# Patient Record
Sex: Male | Born: 1970
Health system: Southern US, Community
[De-identification: ages and names within clinical notes are randomized; demographics above are authoritative.]

## PROBLEM LIST (undated history)

## (undated) DIAGNOSIS — T7840XA Allergy, unspecified, initial encounter: Secondary | ICD-10-CM

## (undated) DIAGNOSIS — M549 Dorsalgia, unspecified: Secondary | ICD-10-CM

## (undated) HISTORY — DX: Allergy, unspecified, initial encounter: T78.40XA

---

## 2010-03-20 ENCOUNTER — Encounter: Admission: RE | Admit: 2010-03-20 | Discharge: 2010-03-20 | Payer: Self-pay | Admitting: Family Medicine

## 2010-04-26 ENCOUNTER — Encounter
Admission: RE | Admit: 2010-04-26 | Discharge: 2010-04-26 | Payer: Self-pay | Source: Home / Self Care | Attending: Allergy | Admitting: Allergy

## 2011-11-20 ENCOUNTER — Ambulatory Visit (INDEPENDENT_AMBULATORY_CARE_PROVIDER_SITE_OTHER): Payer: 59 | Admitting: Family Medicine

## 2011-11-20 VITALS — BP 110/70 | HR 85 | Temp 98.4°F | Resp 16 | Ht 69.0 in | Wt 169.0 lb

## 2011-11-20 DIAGNOSIS — M459 Ankylosing spondylitis of unspecified sites in spine: Secondary | ICD-10-CM

## 2011-11-20 DIAGNOSIS — M545 Low back pain, unspecified: Secondary | ICD-10-CM

## 2011-11-20 LAB — POCT CBC
Granulocyte percent: 69.2 %G (ref 37–80)
HCT, POC: 42.9 % — AB (ref 43.5–53.7)
Hemoglobin: 13.1 g/dL — AB (ref 14.1–18.1)
Lymph, poc: 2.5 (ref 0.6–3.4)
MCHC: 30.5 g/dL — AB (ref 31.8–35.4)
MPV: 7.9 fL (ref 0–99.8)
POC Granulocyte: 8 — AB (ref 2–6.9)
POC MID %: 9.1 %M (ref 0–12)
RBC: 4.62 M/uL — AB (ref 4.69–6.13)

## 2011-11-20 MED ORDER — CELECOXIB 200 MG PO CAPS: 200.0000 mg | ORAL_CAPSULE | Freq: Two times a day (BID) | ORAL | Status: AC

## 2011-11-20 NOTE — Progress Notes (Signed)
Patient ID: Kevin Garza, male   DOB: [birth year], 41 y.o.   MRN: [id number] Kevin Garza is a 41 y.o. male who presents to Urgent Care today for worsening low back pain x 2 weeks.  1.  Lumbago:  41 yo with known ankylosing spondylitis who presents today with 2 week history of low back pain.  Works as Orthoptist.  Has been working more often than usual.  Stiffness in AM in back, also some stiffness in hands and wrist that easily loosens up after a few minutes of stretching.    Followed in past by Dr. Marcheta [facility], but has been doing well and hasn't seen her for past 2 years. Has occaisonal flares which have responded well to Celebrex in past.     Denies any neck pain, bowel/bladder incontinence, LE weakness, saddle anesthesia.    PMH reviewed.  Note ankylosing spondylitis. ROS as above otherwise neg.  No chest pain, palpitations, SOB, Fever, Chills, Abd pain, N/V/D.  Medications reviewed. No current outpatient prescriptions on file.    Exam:  BP 110/70  Pulse 85  Temp 98.4 F (36.9 C)  Resp 16  Ht 5\' 9"  (1.753 m)  Wt 169 lb (76.658 kg)  BMI 24.96 kg/m2 Gen: Well NAD HEENT: EOMI,  MMM Lungs: CTABL Nl WOB Heart: RRR no MRG Abd: NABS, NT, ND Back - Normal skin, Spine with normal alignment and no deformity.  No tenderness to vertebral process palpation.  Paraspinous muscles are tender and with notable spasm BL lumbar region.   Range of motion is full at neck but decreased lumbar sacral regions due to pain.  No LE weakness or numbness.  Neuro:  Sensation intact BL LE's, no limp.     Results for orders placed in visit on 11/20/11  POCT CBC      Component Value Range   WBC 11.5 (*) 4.6 - 10.2 K/uL   Lymph, poc 2.5  0.6 - 3.4   POC LYMPH PERCENT 21.7  10 - 50 %L   MID (cbc) 1.0 (*) 0 - 0.9   POC MID % 9.1  0 - 12 %M   POC Granulocyte 8.0 (*) 2 - 6.9   Granulocyte percent 69.2  37 - 80 %G   RBC 4.62 (*) 4.69 - 6.13 M/uL   Hemoglobin 13.1 (*) 14.1  - 18.1 g/dL   HCT, POC 40.9 (*) 81.1 - 53.7 %   MCV 93.3  80 - 97 fL   MCH, POC 28.5  27 - 31.2 pg   MCHC 30.5 (*) 31.8 - 35.4 g/dL   RDW, POC 91.4     Platelet Count, POC 449 (*) 142 - 424 K/uL   MPV 7.9  0 - 99.8 fL     Assessment and Plan:  1.  Lumbago:  Treat short-term Celebrex.  Await results of ESR and CMET.  May benefit from Prednisone, but hold off on this currently until see results of ESR and to see if trial of Celebrex helps.  Will call with results.    2.  Ankylosing spondylitis:  Checking CMET, CK, ESR today.  Recommended he follow-up with Rheumatology in next month or so since he hasn't been seen there in so long.  Patient agreed.

## 2011-11-20 NOTE — Patient Instructions (Addendum)
I have sent in the Celebrex for you. We will either call or send you the lab results in a letter. Following up with your rheumatologist would be a good idea, especially if this flare up doesn't improve.    It was good to meet you

## 2011-11-21 LAB — COMPREHENSIVE METABOLIC PANEL
ALT: 17 U/L (ref 0–53)
AST: 13 U/L (ref 0–37)
Albumin: 4.2 g/dL (ref 3.5–5.2)
Alkaline Phosphatase: 61 U/L (ref 39–117)
BUN: 17 mg/dL (ref 6–23)
Calcium: 9.3 mg/dL (ref 8.4–10.5)
Chloride: 103 mEq/L (ref 96–112)
Potassium: 4.2 mEq/L (ref 3.5–5.3)
Sodium: 138 mEq/L (ref 135–145)
Total Protein: 7.7 g/dL (ref 6.0–8.3)

## 2011-11-22 ENCOUNTER — Encounter: Payer: Self-pay | Admitting: Family Medicine

## 2011-12-11 IMAGING — CT CT HEAD W/O CM
2 series · 16 of 30 positions shown, 20 images · non-contrast
Comparison: None.

CLINICAL DATA: Headache and dizziness.

CT HEAD WITHOUT CONTRAST
TECHNIQUE: Contiguous axial images were obtained from the base of
the skull through the vertex without contrast.

[Series 2: head w/o · axial · non-contrast · 0.49mm/px · z∈[+46,+186]mm · 13 of 32 slices shown, 17 images]
[im 3/32  brain]
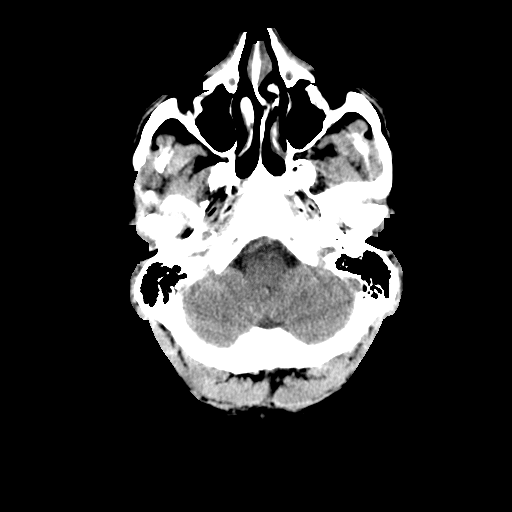
[im 3/32  bone]
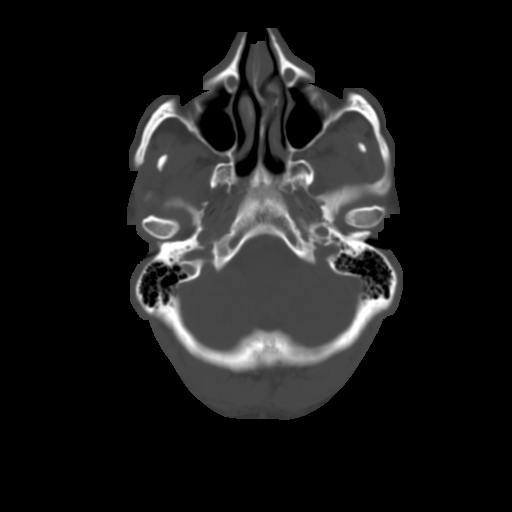
[im 5/32  brain]
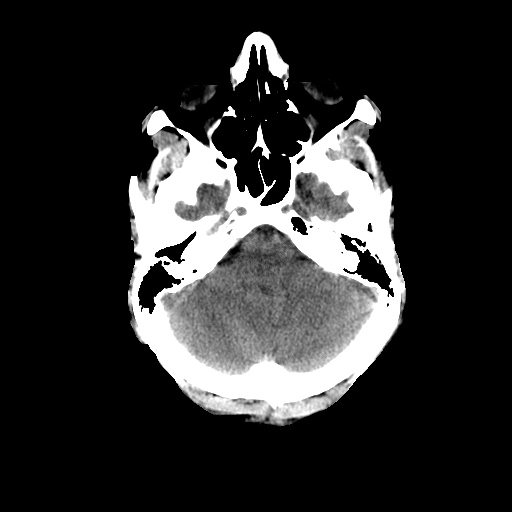
[im 7/32  brain]
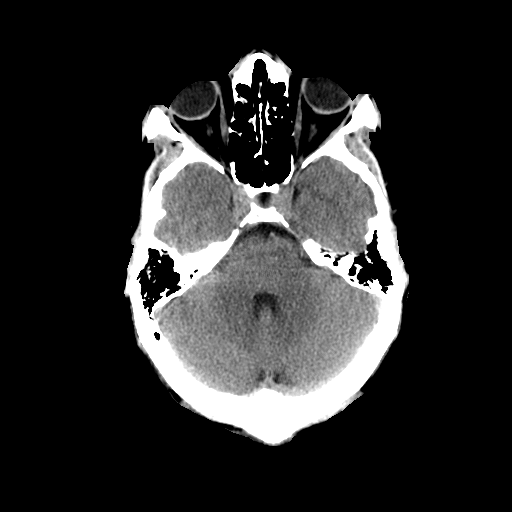
[im 9/32  brain]
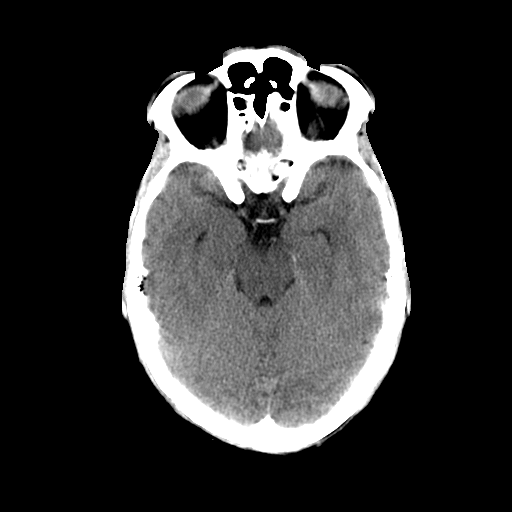
[im 12/32  brain]
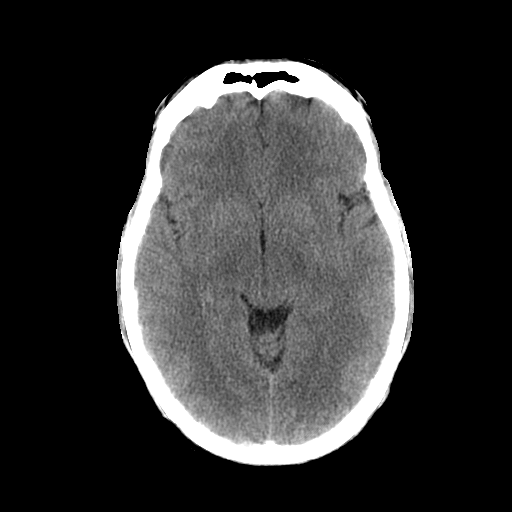
[im 12/32  bone]
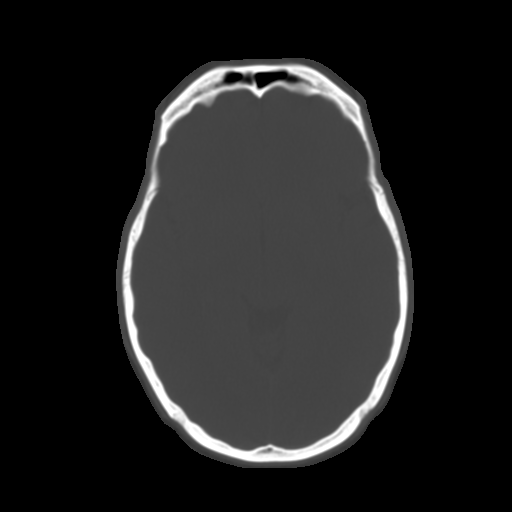
[im 14/32  brain]
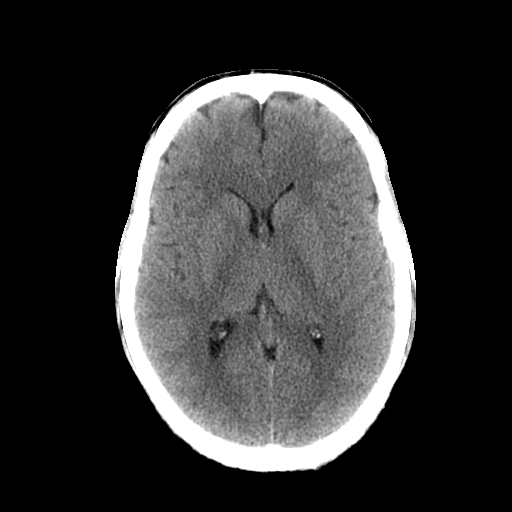
[im 16/32  brain]
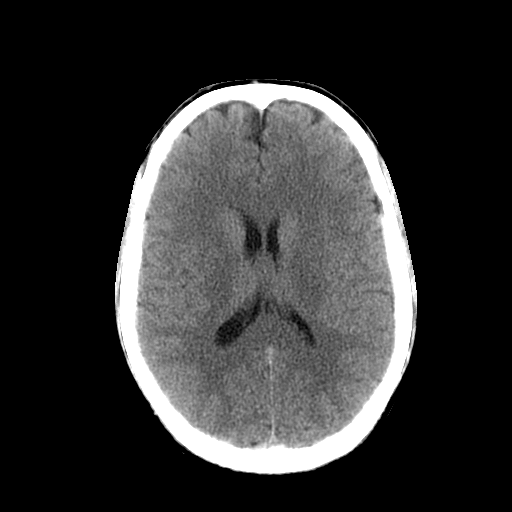
[im 18/32  brain]
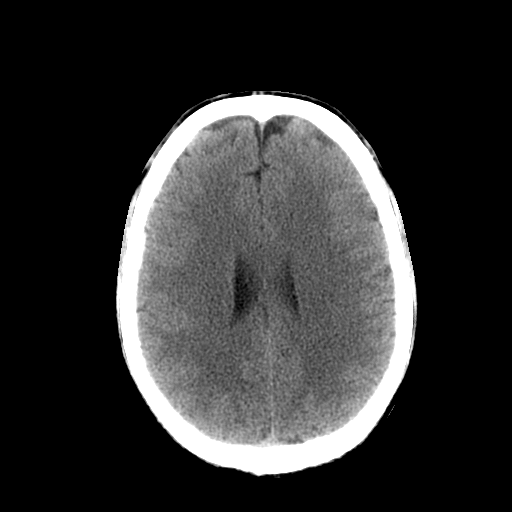
[im 20/32  brain]
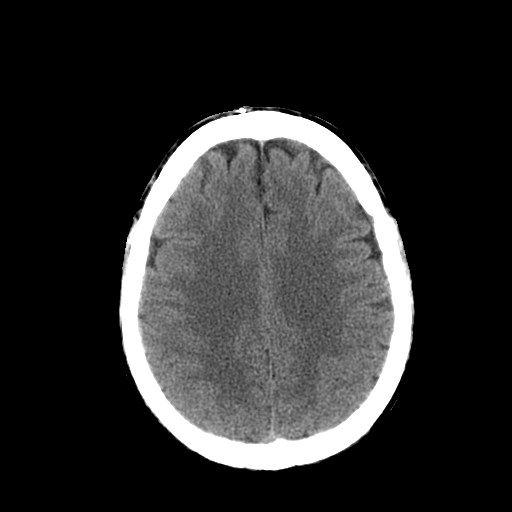
[im 20/32  bone]
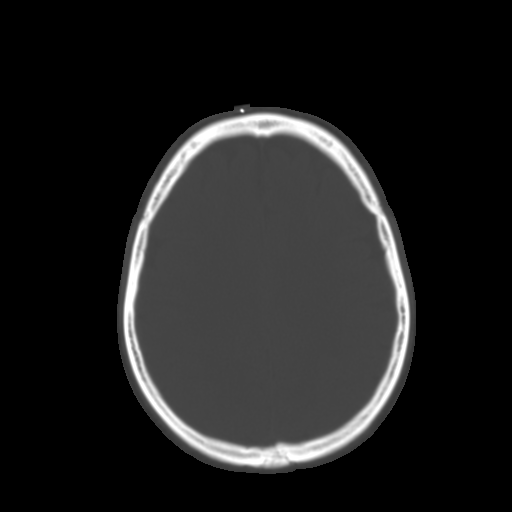
[im 23/32  brain]
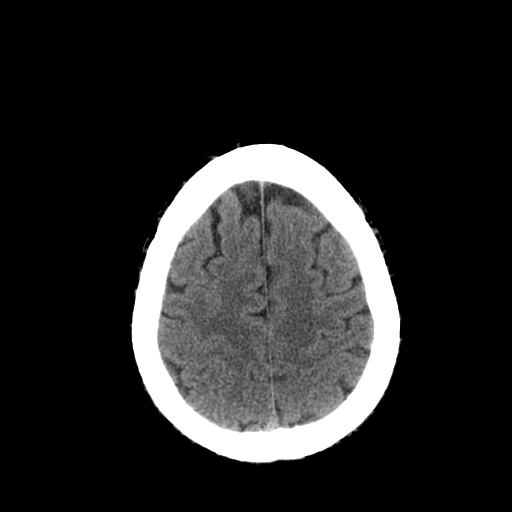
[im 25/32  brain]
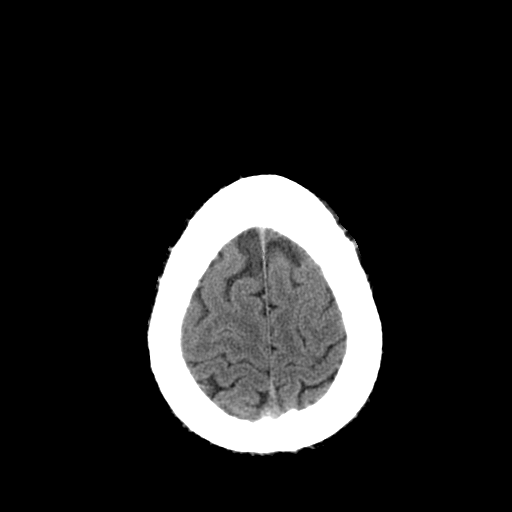
[im 27/32  brain]
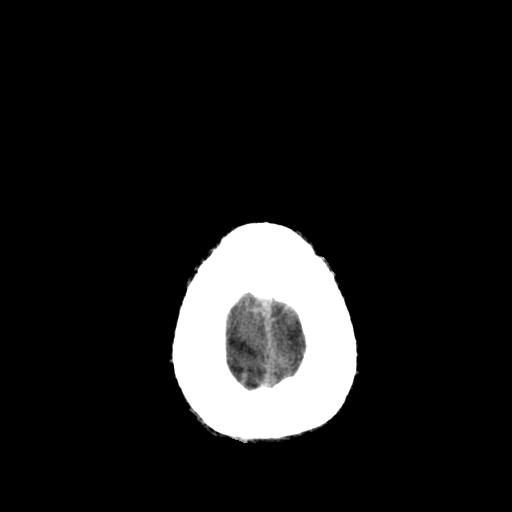
[im 29/32  brain]
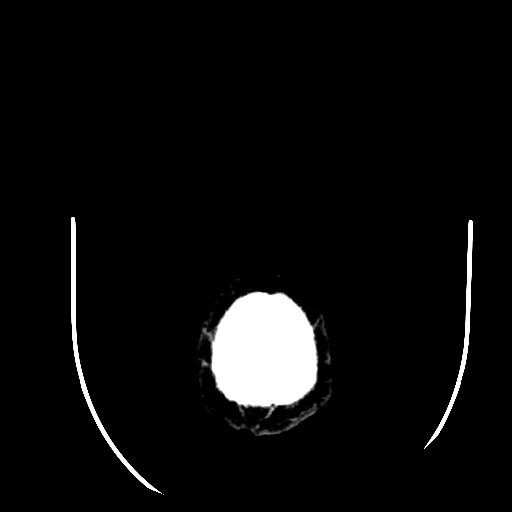
[im 29/32  bone]
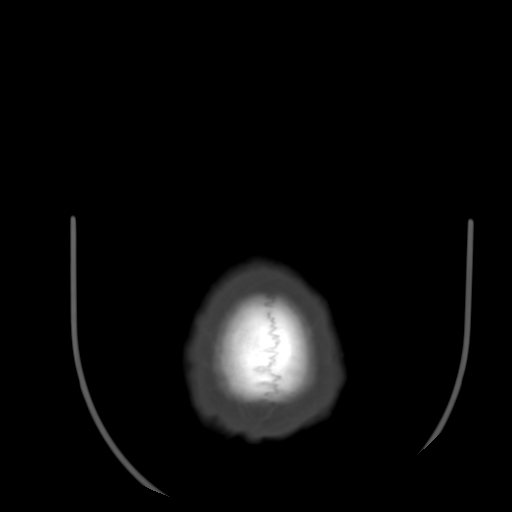

[Series 3: head bone · axial · 0.49mm/px · z∈[+46,+94]mm · 3 of 32 slices shown]
[im 3/32  bone]
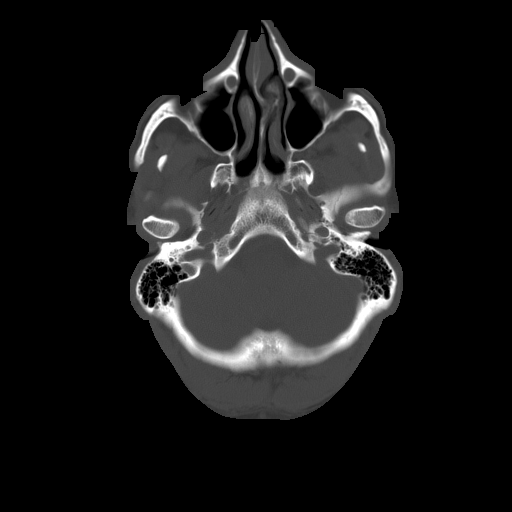
[im 7/32  bone]
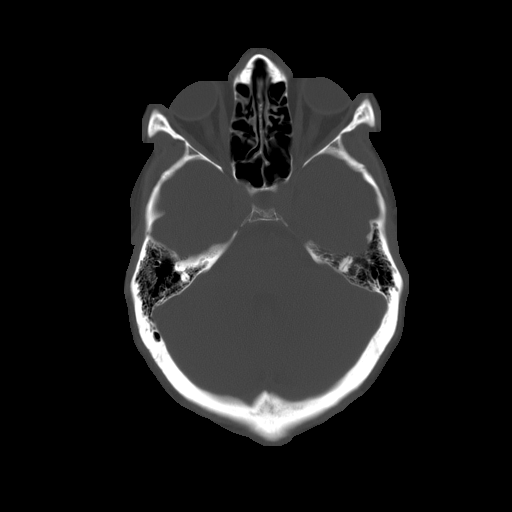
[im 12/32  bone]
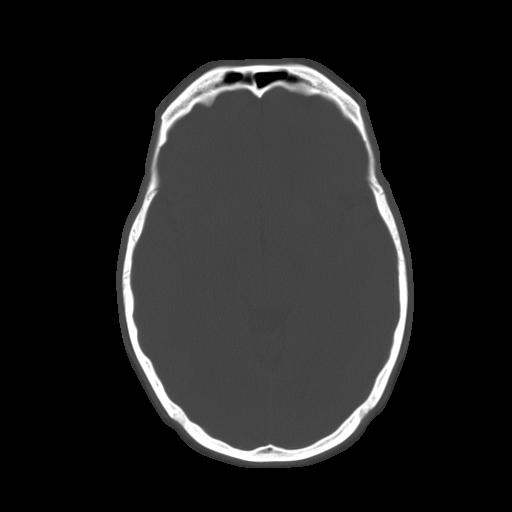

[16 of 30 positions shown; findings below may reference images not displayed]

FINDINGS: There is no acute intracranial infarction, hemorrhage, or
mass lesion.  The brain parenchyma is normal.  Skull is normal.
The visualized paranasal sinuses and mastoid air cells and middle
ear cavities are clear.
IMPRESSION: Normal CT scan of the head without contrast.

## 2012-01-17 IMAGING — CT CT PARANASAL SINUSES LIMITED
1 series · 10 of 12 positions shown, 13 images · non-contrast
Comparison: CT 03/20/2010.

CLINICAL DATA: History of pain under the eyes and around nodes.
Evaluation for sinusitis.

CT PARANASAL SINUS LIMITED WITHOUT CONTRAST
TECHNIQUE: Multidetector CT images of the paranasal sinuses were
obtained in a single plane without contrast.

[Series 3: cor soft · axial · 0.35mm/px · z∈[+8,+98]mm · 10 of 12 slices shown, 13 images]
[im 2/12  brain]
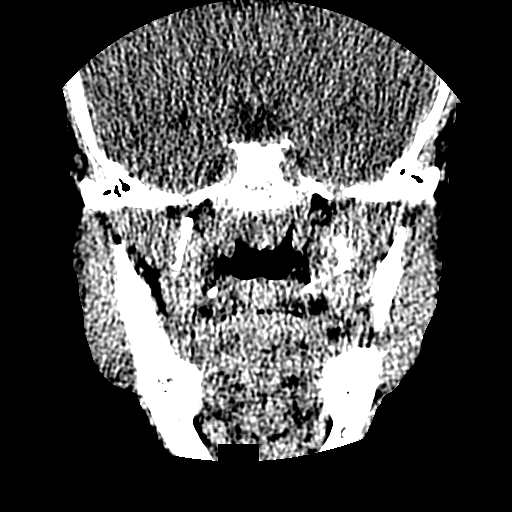
[im 2/12  bone]
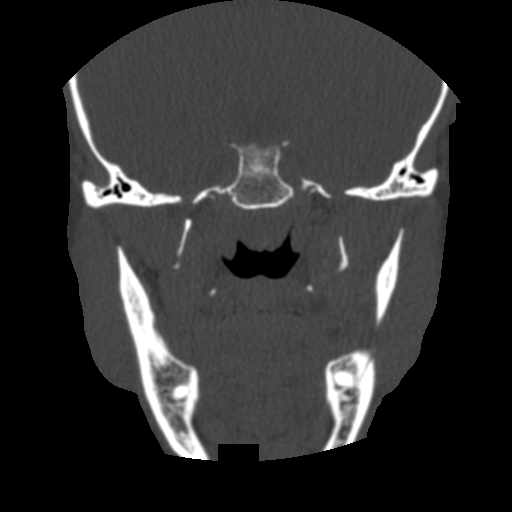
[im 3/12  bone]
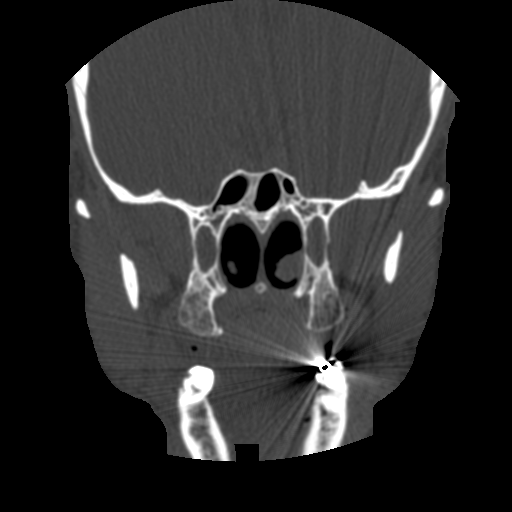
[im 4/12  bone]
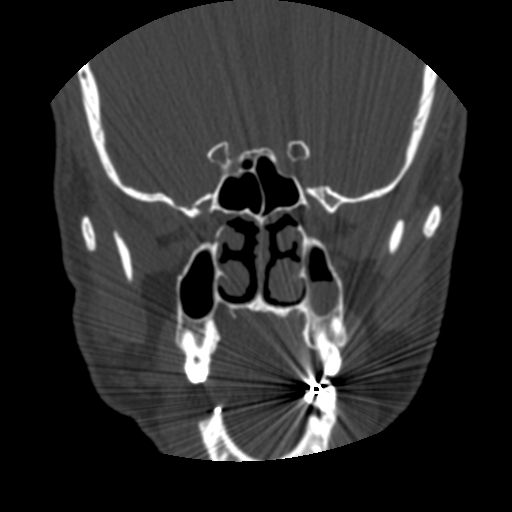
[im 5/12  bone]
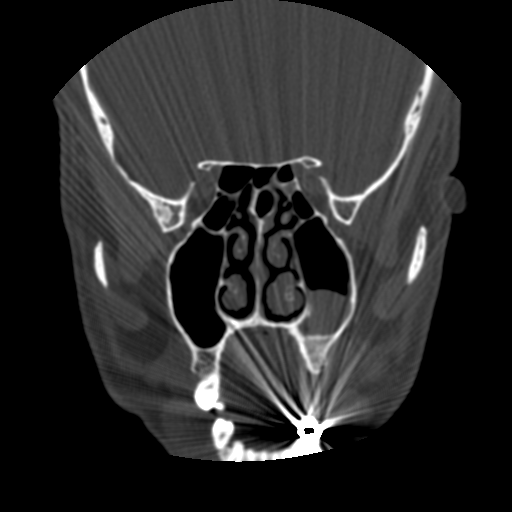
[im 6/12  brain]
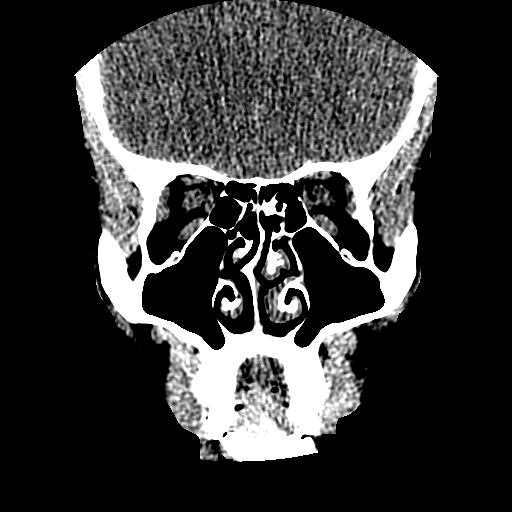
[im 6/12  bone]
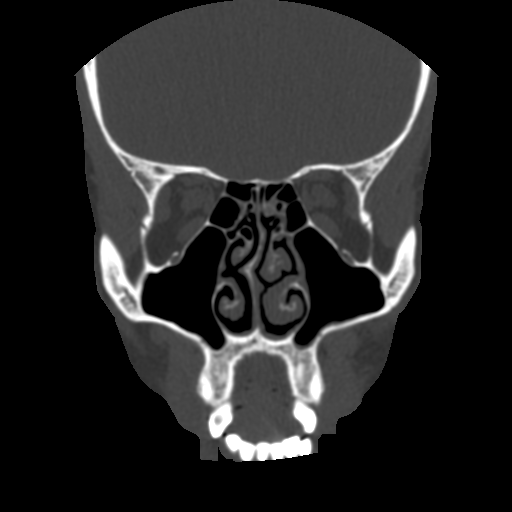
[im 7/12  bone]
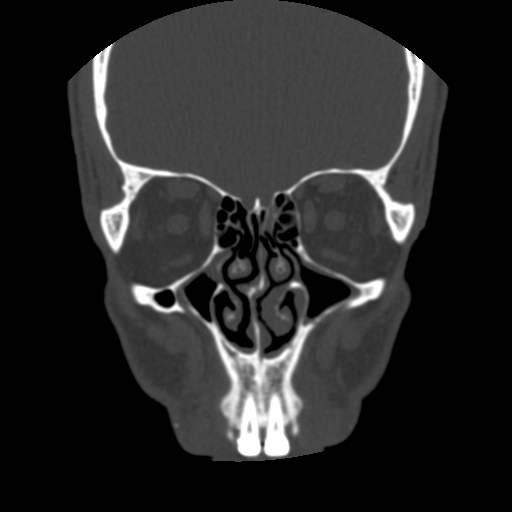
[im 8/12  bone]
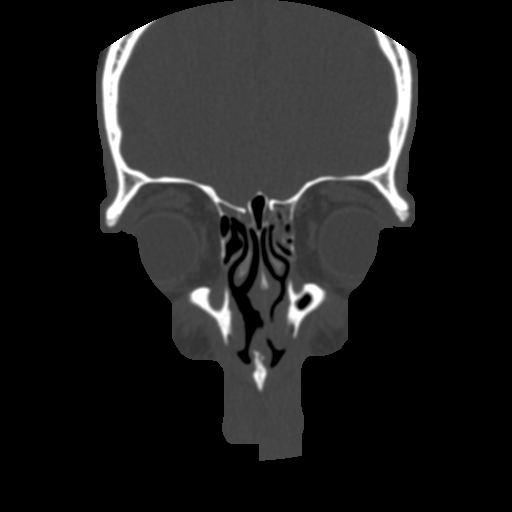
[im 9/12  bone]
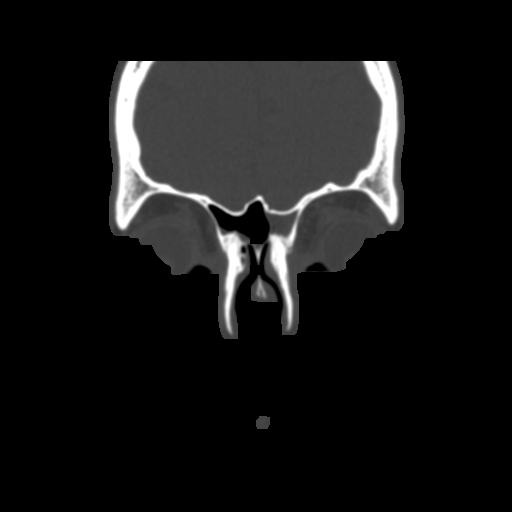
[im 10/12  brain]
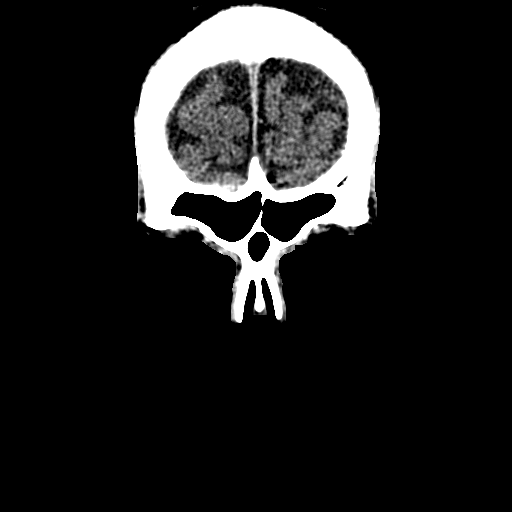
[im 10/12  bone]
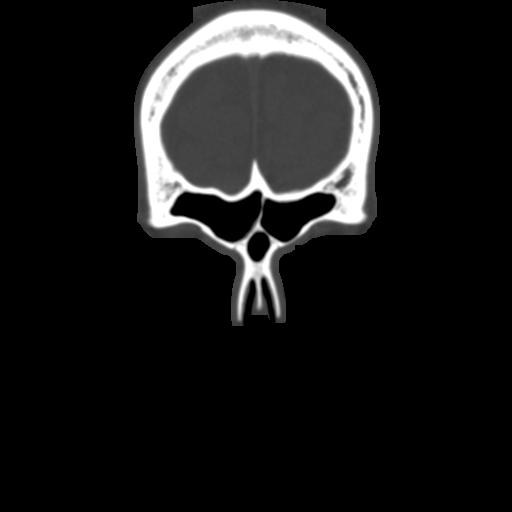
[im 11/12  bone]
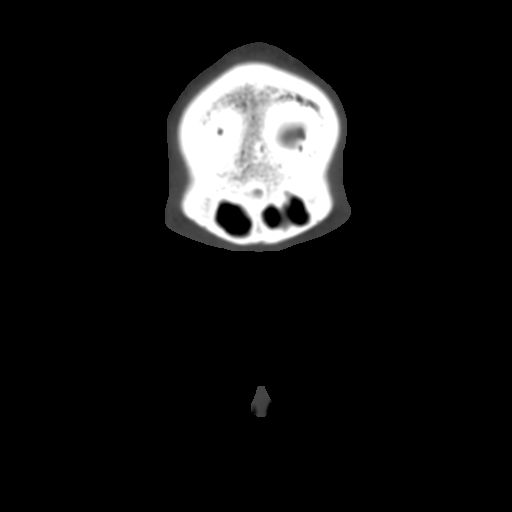

[10 of 12 positions shown; findings below may reference images not displayed]

FINDINGS: Nasal septum and nasal cavity:  There is deviation of the
midportion of the nasal septum to the left with spurring.  No
concha bullosa is evident.

Maxillary sinuses:  There is bilateral mucosal thickening involving
maxillary sinuses.  Rounded  opacity and inferior aspect of right
maxillary sinus is seen consistent with with mucous retention cyst
or polyp.  No air fluid levels are seen.  Right maxillary sinus
ostium is patent.  Left maxillary sinus ostium appears to be
occluded by mucosal thickening.

Sphenoid sinuses:  There is very minimal mucosal thickening
involving superior aspect of left sphenoid sinus.  No air fluid
levels are seen.

Frontal sinuses:  There is mucosal thickening  involving the
posterior aspect of the the right frontal sinus.  There is minimal
mucosal thickening involving the inferior medial aspect of the left
frontal sinus.  No air fluid levels are seen.

Ethmoid sinuses:  There is mucosal thickening involving bilateral
ethmoid air cells.  No air fluid levels seen.  No sinus expansion
or bony destruction is evident.

The small portion of mastoids demonstrated showed aeration of air
cells.
IMPRESSION: Mucosal thickening in right maxillary sinus with mucous retention
cyst or polyp inferiorly.  Right maxillary sinus ostium is patent.

Mucosal thickening in left maxillary sinus with occlusion of the
sinus ostium on the left.

Minimal mucosal thickening involving frontal and sphenoid sinuses.
Mucosal thickening involving bilateral ethmoid air cells.

No sinus expansion or bony destruction is seen.  No air fluid
levels were evident.

Nasal septal deviation and spurring to the left.

## 2012-07-13 ENCOUNTER — Ambulatory Visit (INDEPENDENT_AMBULATORY_CARE_PROVIDER_SITE_OTHER): Payer: 59 | Admitting: Emergency Medicine

## 2012-07-13 VITALS — BP 124/76 | HR 60 | Temp 98.0°F | Resp 16 | Ht 68.0 in | Wt 172.4 lb

## 2012-07-13 DIAGNOSIS — J329 Chronic sinusitis, unspecified: Secondary | ICD-10-CM

## 2012-07-13 DIAGNOSIS — R51 Headache: Secondary | ICD-10-CM

## 2012-07-13 DIAGNOSIS — J342 Deviated nasal septum: Secondary | ICD-10-CM

## 2012-07-13 MED ORDER — AMOXICILLIN-POT CLAVULANATE 875-125 MG PO TABS: 1.0000 | ORAL_TABLET | Freq: Two times a day (BID) | ORAL | Status: DC

## 2012-07-13 MED ORDER — FLUTICASONE PROPIONATE 50 MCG/ACT NA SUSP: 2.0000 | Freq: Every day | NASAL | Status: DC

## 2012-07-13 MED ORDER — PREDNISONE 20 MG PO TABS: ORAL_TABLET | ORAL | Status: DC

## 2012-07-13 NOTE — Progress Notes (Signed)
  Subjective:    Patient ID: Kevin Garza, male    DOB: 17-Nov-1970, 42 y.o.   MRN: [id number]  HPI Patient comes in today. He has environmental allergies regularly, and takes Zyrtec. The past 5 days he complains that he has a full feeling in his face, he has a lot of pressure especially on his right side. He also states that his ears are hurting. When he blows his nose his ears pop. He complains of a really bad headache. The drainage from his nose is clear. Denies any cough. Denies a sore throat or post nasal drip.     He had a scan of his sinuses and they found polyps. No infection was found. He decided to not have a surgery because of the recovery time. He has not used a nasal spray in a long time. He took an antibiotic and steroids.     Review of Systems     Objective:   Physical Exam  Throat is normal. TM is normal. Nasal septum deviation, swollen and red. Lungs are clear.       Assessment & Plan:   1. Augmentin 2. Nasal Spray  3. Taper dose of prednisone.   Follow up if symptoms worsen.

## 2012-07-13 NOTE — Patient Instructions (Signed)

## 2012-08-28 ENCOUNTER — Telehealth: Payer: Self-pay

## 2012-08-28 NOTE — Telephone Encounter (Signed)
Notified this was not a test ordered. Voiced understanding.

## 2012-08-28 NOTE — Telephone Encounter (Signed)
His wife is calling because her husband needs to know what blood type he is. The wife is on the HIPPA form so you can talk to her. Call back number is [phone]

## 2013-11-05 ENCOUNTER — Ambulatory Visit (INDEPENDENT_AMBULATORY_CARE_PROVIDER_SITE_OTHER): Payer: BC Managed Care – PPO | Admitting: Family Medicine

## 2013-11-05 VITALS — BP 116/60 | HR 63 | Temp 98.3°F | Resp 16 | Ht 67.25 in | Wt 173.0 lb

## 2013-11-05 DIAGNOSIS — J302 Other seasonal allergic rhinitis: Secondary | ICD-10-CM

## 2013-11-05 DIAGNOSIS — J3089 Other allergic rhinitis: Secondary | ICD-10-CM

## 2013-11-05 DIAGNOSIS — M542 Cervicalgia: Secondary | ICD-10-CM

## 2013-11-05 NOTE — Patient Instructions (Signed)
Ibuprofen 2 tablets twice a day as needed for pain Heat to neck Sudafed- 1 tablet in the morning and after lunch as needed for congestion Return if worsening symptoms

## 2013-11-05 NOTE — Progress Notes (Signed)
   Subjective:    Patient ID: Kevin Garza, male    DOB: 1970-12-11, 43 y.o.   MRN: [id number]  HPI Patient presents today with pain behind both ears and down teeth and beside nose. This has been going on for several weeks. Woke up this morning feeling fine, went to his job and started feeling bad.  Has history of sinus and allergy problems and has taken unknown allergy medicine with some improvement, but makes him feel "drunk." He is consistently using Flonase which he feels is somewhat helpful. Thinks this pain may be related to stress he is carrying in his neck as it feels like it is coming up from his neck sometimes. 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  Has history of back pain, ankylosing spondylosis. This rarely bothers him.   Review of Systems Occasional clear nasal drainage, no fever, occasional cough, no sore throat. No numbness, tingling down arms occasionally x 10 years    Objective:   Physical Exam  Vitals reviewed. Constitutional: He is oriented to person, place, and time. He appears well-developed and well-nourished.  HENT:  Head: Normocephalic and atraumatic.  Right Ear: Tympanic membrane, external ear and ear canal normal.  Left Ear: Tympanic membrane, external ear and ear canal normal.  Nose: Mucosal edema and rhinorrhea present. Right sinus exhibits no maxillary sinus tenderness and no frontal sinus tenderness. Left sinus exhibits no maxillary sinus tenderness and no frontal sinus tenderness.  Mouth/Throat: Uvula is midline and mucous membranes are normal. Oropharyngeal exudate and posterior oropharyngeal erythema present. No posterior oropharyngeal edema or tonsillar abscesses.  Eyes: Conjunctivae are normal. Right eye exhibits no discharge. Left eye exhibits no discharge. No scleral icterus.  Neck: Normal range of motion and full passive range of motion without pain. Neck supple. Muscular  tenderness present. No spinous process tenderness present. No rigidity. No edema, no erythema and normal range of motion present.  Cardiovascular: Normal rate, regular rhythm and normal heart sounds.   Pulmonary/Chest: Effort normal and breath sounds normal.  Musculoskeletal: Normal range of motion.  Lymphadenopathy:    He has no cervical adenopathy.  Neurological: He is alert and oriented to person, place, and time. He has normal reflexes.  Skin: Skin is warm and dry.  Psychiatric: He has a normal mood and affect. His behavior is normal. Judgment and thought content normal.      Assessment & Plan:  1. Neck pain  2. Other seasonal allergic rhinitis  Discussed patient's symptoms and that there doesn't seem to be an infectious process. Patient reports that he doesn't like to take medication. Encouraged him to try more aggressive symptomatic relief.  Patient Instructions  Ibuprofen 2 tablets twice a day as needed for pain Heat to neck Sudafed- 1 tablet in the morning and after lunch as needed for congestion Return if worsening symptoms   Elby Beck, FNP-BC  [facility]  11/05/2013 5:26 PM

## 2016-02-24 ENCOUNTER — Ambulatory Visit (INDEPENDENT_AMBULATORY_CARE_PROVIDER_SITE_OTHER): Payer: Managed Care, Other (non HMO) | Admitting: Family Medicine

## 2016-02-24 VITALS — BP 110/76 | HR 75 | Temp 98.4°F | Resp 16 | Ht 68.0 in | Wt 168.6 lb

## 2016-02-24 DIAGNOSIS — J301 Allergic rhinitis due to pollen: Secondary | ICD-10-CM | POA: Diagnosis not present

## 2016-02-24 DIAGNOSIS — Z716 Tobacco abuse counseling: Secondary | ICD-10-CM | POA: Diagnosis not present

## 2016-02-24 DIAGNOSIS — J0101 Acute recurrent maxillary sinusitis: Secondary | ICD-10-CM | POA: Diagnosis not present

## 2016-02-24 MED ORDER — FLUTICASONE PROPIONATE 50 MCG/ACT NA SUSP: 2.0000 | Freq: Every day | NASAL | 12 refills | Status: AC

## 2016-02-24 MED ORDER — AMOXICILLIN-POT CLAVULANATE 875-125 MG PO TABS: 1.0000 | ORAL_TABLET | Freq: Two times a day (BID) | ORAL | 0 refills | Status: DC

## 2016-02-24 NOTE — Progress Notes (Signed)
Subjective:    Patient ID: Kevin Garza, male    DOB: [birth year], 45 y.o.   MRN: [id number]  02/24/2016  sneezing (runny nose x 6 wks, pt declined flu shot); Headache; and Sore Throat   HPI This 45 y.o. male presents for six week history of sneezing, sore throat, headache. No fever/chills/sweats.  +HA; +sinus pressure. No ear pain.  +rhinorrhea yellow.  +nasal congestion.+PND  +cough; +sputum production yellow.  Wheezing the first week.  Mild SOB in mornings. No nausea, vomitng, diarrhea.  Evaluated at [facility] the first week; prescribed Allegra 115m daily for five days.  Also prescribed cough medication. +tobacco abuse.  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  +tobacco abuse.   Review of Systems  Constitutional: Negative for activity change, appetite change, chills, diaphoresis, fatigue and fever.  HENT: Positive for congestion, postnasal drip, rhinorrhea, sinus pressure, sneezing and sore throat. Negative for ear pain, trouble swallowing and voice change.   Respiratory: Positive for cough and shortness of breath.   Cardiovascular: Negative for chest pain, palpitations and leg swelling.  Gastrointestinal: Negative for abdominal pain, diarrhea, nausea and vomiting.  Endocrine: Negative for cold intolerance, heat intolerance, polydipsia, polyphagia and polyuria.  Skin: Negative for color change, rash and wound.  Neurological: Positive for headaches. Negative for dizziness, tremors, seizures, syncope, facial asymmetry, speech difficulty, weakness, light-headedness and numbness.  Psychiatric/Behavioral: Negative for dysphoric mood and sleep disturbance. The patient is not nervous/anxious.     Past Medical History:  Diagnosis Date  . Allergy    No past surgical history on file. No Known Allergies Current Outpatient Prescriptions  Medication Sig Dispense Refill  . cetirizine (ZYRTEC) 10 MG  tablet Take 10 mg by mouth daily.    . fluticasone (FLONASE) 50 MCG/ACT nasal spray Place 2 sprays into the nose daily. 16 g 6  . amoxicillin-clavulanate (AUGMENTIN) 875-125 MG tablet Take 1 tablet by mouth 2 (two) times daily. 20 tablet 0  . fluticasone (FLONASE) 50 MCG/ACT nasal spray Place 2 sprays into both nostrils daily. 16 g 12   No current facility-administered medications for this visit.    Social History   Social History  . Marital status: Married    Spouse name: N/A  . Number of children: N/A  . Years of education: N/A   Occupational History  . Not on file.   Social History Main Topics  . Smoking status: Current Every Day Smoker    Packs/day: 1.00    Years: 15.00    Types: Cigarettes  . Smokeless tobacco: Never Used  . Alcohol use 3.6 oz/week    6 Standard drinks or equivalent per week  . Drug use: No  . Sexual activity: Not on file   Other Topics Concern  . Not on file   Social History Narrative  . No narrative on file   Family History  Problem Relation Age of Onset  . Cancer Mother   . Hyperlipidemia Father        Objective:    BP 110/76   Pulse 75   Temp 98.4 F (36.9 C) (Oral)   Resp 16   Ht 5' 8"  (1.727 m)   Wt 168 lb 9.6 oz (76.5 kg)   SpO2 98%   BMI 25.64 kg/m  Physical Exam  Constitutional: He is oriented to person, place, and time. He appears well-developed and well-nourished. No distress.  HENT:  Head: Normocephalic and atraumatic.  Right Ear: Tympanic membrane, external ear and ear canal normal.  Left Ear: Tympanic membrane, external ear and ear canal normal.  Nose: Right sinus exhibits maxillary sinus tenderness. Right sinus exhibits no frontal sinus tenderness. Left sinus exhibits maxillary sinus tenderness. Left sinus exhibits no frontal sinus tenderness.  Mouth/Throat: Uvula is midline, oropharynx is clear and moist and mucous membranes are normal. No oropharyngeal exudate.  Eyes: Conjunctivae and EOM are normal. Pupils are  equal, round, and reactive to light.  Neck: Normal range of motion. Neck supple. Carotid bruit is not present. No thyromegaly present.  Cardiovascular: Normal rate, regular rhythm, normal heart sounds and intact distal pulses.  Exam reveals no gallop and no friction rub.   No murmur heard. Pulmonary/Chest: Effort normal and breath sounds normal. He has no wheezes. He has no rales.  Lymphadenopathy:    He has no cervical adenopathy.  Neurological: He is alert and oriented to person, place, and time. No cranial nerve deficit.  Skin: Skin is warm and dry. No rash noted. He is not diaphoretic.  Psychiatric: He has a normal mood and affect. His behavior is normal.  Nursing note and vitals reviewed.       Assessment & Plan:   1. Acute recurrent maxillary sinusitis   2. Acute seasonal allergic rhinitis due to pollen   3. Tobacco abuse counseling    -New. -rx for Flonase, Augmentin. -start Zyrtec 39m daily and nettie pot daily for two weeks and then PRN. -stop tobacco abuse.  No orders of the defined types were placed in this encounter.  Meds ordered this encounter  Medications  . cetirizine (ZYRTEC) 10 MG tablet    Sig: Take 10 mg by mouth daily.  .Marland Kitchenamoxicillin-clavulanate (AUGMENTIN) 875-125 MG tablet    Sig: Take 1 tablet by mouth 2 (two) times daily.    Dispense:  20 tablet    Refill:  0  . fluticasone (FLONASE) 50 MCG/ACT nasal spray    Sig: Place 2 sprays into both nostrils daily.    Dispense:  16 g    Refill:  12    No Follow-up on file.   Adilyn Humes MElayne Guerin M.D. [facility]  [phone]

## 2016-02-24 NOTE — Patient Instructions (Addendum)
IF you received an x-ray today, you will receive an invoice from [facility]. Please contact [facility] at [phone] with questions or concerns regarding your invoice.   IF you received labwork today, you will receive an invoice from [employer]. Please contact [facility] at [phone] with questions or concerns regarding your invoice.   Our billing staff will not be able to assist you with questions regarding bills from these companies.  You will be contacted with the lab results as soon as they are available. The fastest way to get your results is to activate your My Chart account. Instructions are located on the last page of this paperwork. If you have not heard from [place] regarding the results in 2 weeks, please contact this office.    Allergic Rhinitis Allergic rhinitis is when the mucous membranes in the nose respond to allergens. Allergens are particles in the air that cause your body to have an allergic reaction. This causes you to release allergic antibodies. Through a chain of events, these eventually cause you to release histamine into the blood stream. Although meant to protect the body, it is this release of histamine that causes your discomfort, such as frequent sneezing, congestion, and an itchy, runny nose.  CAUSES Seasonal allergic rhinitis (hay fever) is caused by pollen allergens that may come from grasses, trees, and weeds. Year-round allergic rhinitis (perennial allergic rhinitis) is caused by allergens such as house dust mites, pet dander, and mold spores. SYMPTOMS  Nasal stuffiness (congestion).  Itchy, runny nose with sneezing and tearing of the eyes. DIAGNOSIS Your health care provider can help you determine the allergen or allergens that trigger your symptoms. If you and your health care provider are unable to determine the allergen, skin or blood testing may be used. Your health care provider will diagnose your  condition after taking your health history and performing a physical exam. Your health care provider may assess you for other related conditions, such as asthma, pink eye, or an ear infection. TREATMENT Allergic rhinitis does not have a cure, but it can be controlled by:  Medicines that block allergy symptoms. These may include allergy shots, nasal sprays, and oral antihistamines.  Avoiding the allergen. Hay fever may often be treated with antihistamines in pill or nasal spray forms. Antihistamines block the effects of histamine. There are over-the-counter medicines that may help with nasal congestion and swelling around the eyes. Check with your health care provider before taking or giving this medicine. If avoiding the allergen or the medicine prescribed do not work, there are many new medicines your health care provider can prescribe. Stronger medicine may be used if initial measures are ineffective. Desensitizing injections can be used if medicine and avoidance does not work. Desensitization is when a patient is given ongoing shots until the body becomes less sensitive to the allergen. Make sure you follow up with your health care provider if problems continue. HOME CARE INSTRUCTIONS It is not possible to completely avoid allergens, but you can reduce your symptoms by taking steps to limit your exposure to them. It helps to know exactly what you are allergic to so that you can avoid your specific triggers. SEEK MEDICAL CARE IF:  You have a fever.  You develop a cough that does not stop easily (persistent).  You have shortness of breath.  You start wheezing.  Symptoms interfere with normal daily activities.   This information is not intended to replace advice given to you by your  health care provider. Make sure you discuss any questions you have with your health care provider.   Document Released: 01/23/2001 Document Revised: 05/21/2014 Document Reviewed: 01/05/2013 Elsevier Interactive  Patient Education [employer].

## 2016-06-12 ENCOUNTER — Ambulatory Visit: Payer: Managed Care, Other (non HMO)

## 2017-04-30 ENCOUNTER — Other Ambulatory Visit: Payer: Self-pay

## 2017-04-30 ENCOUNTER — Ambulatory Visit (INDEPENDENT_AMBULATORY_CARE_PROVIDER_SITE_OTHER): Payer: Managed Care, Other (non HMO) | Admitting: Family Medicine

## 2017-04-30 ENCOUNTER — Encounter: Payer: Self-pay | Admitting: Family Medicine

## 2017-04-30 VITALS — BP 122/80 | HR 74 | Temp 98.7°F | Resp 18 | Ht 68.0 in | Wt 170.4 lb

## 2017-04-30 DIAGNOSIS — J0100 Acute maxillary sinusitis, unspecified: Secondary | ICD-10-CM | POA: Diagnosis not present

## 2017-04-30 MED ORDER — FLUTICASONE PROPIONATE 50 MCG/ACT NA SUSP
2.0000 | Freq: Every day | NASAL | 6 refills | Status: AC
Start: 2017-04-30 — End: ?

## 2017-04-30 MED ORDER — AMOXICILLIN-POT CLAVULANATE 875-125 MG PO TABS: 1.0000 | ORAL_TABLET | Freq: Two times a day (BID) | ORAL | 0 refills | Status: AC

## 2017-04-30 NOTE — Patient Instructions (Addendum)
1. Start using flonase, nasal saline washes (netty pot) and oral decongestant (such was phenylephrine) 2. I have sent a prescription for antibiotics (augmentin) which you do not need right now. I am sending it in case you are not getting better after a good trial of above (at least 5 days) or are getting worse, more pressure/headaches witth purulent nasal drainage and fever.    Sinusitis, Adult Sinusitis is soreness and inflammation of your sinuses. Sinuses are hollow spaces in the bones around your face. Your sinuses are located:  Around your eyes.  In the middle of your forehead.  Behind your nose.  In your cheekbones.  Your sinuses and nasal passages are lined with a stringy fluid (mucus). Mucus normally drains out of your sinuses. When your nasal tissues become inflamed or swollen, the mucus can become trapped or blocked so air cannot flow through your sinuses. This allows bacteria, viruses, and funguses to grow, which leads to infection. Sinusitis can develop quickly and last for 7?10 days (acute) or for more than 12 weeks (chronic). Sinusitis often develops after a cold. What are the causes? This condition is caused by anything that creates swelling in the sinuses or stops mucus from draining, including:  Allergies.  Asthma.  Bacterial or viral infection.  Abnormally shaped bones between the nasal passages.  Nasal growths that contain mucus (nasal polyps).  Narrow sinus openings.  Pollutants, such as chemicals or irritants in the air.  A foreign object stuck in the nose.  A fungal infection. This is rare.  What increases the risk? The following factors may make you more likely to develop this condition:  Having allergies or asthma.  Having had a recent cold or respiratory tract infection.  Having structural deformities or blockages in your nose or sinuses.  Having a weak immune system.  Doing a lot of swimming or diving.  Overusing nasal  sprays.  Smoking.  What are the signs or symptoms? The main symptoms of this condition are pain and a feeling of pressure around the affected sinuses. Other symptoms include:  Upper toothache.  Earache.  Headache.  Bad breath.  Decreased sense of smell and taste.  A cough that may get worse at night.  Fatigue.  Fever.  Thick drainage from your nose. The drainage is often green and it may contain pus (purulent).  Stuffy nose or congestion.  Postnasal drip. This is when extra mucus collects in the throat or back of the nose.  Swelling and warmth over the affected sinuses.  Sore throat.  Sensitivity to light.  How is this diagnosed? This condition is diagnosed based on symptoms, a medical history, and a physical exam. To find out if your condition is acute or chronic, your health care provider may:  Look in your nose for signs of nasal polyps.  Tap over the affected sinus to check for signs of infection.  View the inside of your sinuses using an imaging device that has a light attached (endoscope).  If your health care provider suspects that you have chronic sinusitis, you may also:  Be tested for allergies.  Have a sample of mucus taken from your nose (nasal culture) and checked for bacteria.  Have a mucus sample examined to see if your sinusitis is related to an allergy.  If your sinusitis does not respond to treatment and it lasts longer than 8 weeks, you may have an MRI or CT scan to check your sinuses. These scans also help to determine how severe your  infection is. In rare cases, a bone biopsy may be done to rule out more serious types of fungal sinus disease. How is this treated? Treatment for sinusitis depends on the cause and whether your condition is chronic or acute. If a virus is causing your sinusitis, your symptoms will go away on their own within 10 days. You may be given medicines to relieve your symptoms, including:  Topical nasal decongestants.  They shrink swollen nasal passages and let mucus drain from your sinuses.  Antihistamines. These drugs block inflammation that is triggered by allergies. This can help to ease swelling in your nose and sinuses.  Topical nasal corticosteroids. These are nasal sprays that ease inflammation and swelling in your nose and sinuses.  Nasal saline washes. These rinses can help to get rid of thick mucus in your nose.  If your condition is caused by bacteria, you will be given an antibiotic medicine. If your condition is caused by a fungus, you will be given an antifungal medicine. Surgery may be needed to correct underlying conditions, such as narrow nasal passages. Surgery may also be needed to remove polyps. Follow these instructions at home: Medicines  Take, use, or apply over-the-counter and prescription medicines only as told by your health care provider. These may include nasal sprays.  If you were prescribed an antibiotic medicine, take it as told by your health care provider. Do not stop taking the antibiotic even if you start to feel better. Hydrate and Humidify  Drink enough water to keep your urine clear or pale yellow. Staying hydrated will help to thin your mucus.  Use a cool mist humidifier to keep the humidity level in your home above 50%.  Inhale steam for 10-15 minutes, 3-4 times a day or as told by your health care provider. You can do this in the bathroom while a hot shower is running.  Limit your exposure to cool or dry air. Rest  Rest as much as possible.  Sleep with your head raised (elevated).  Make sure to get enough sleep each night. General instructions  Apply a warm, moist washcloth to your face 3-4 times a day or as told by your health care provider. This will help with discomfort.  Wash your hands often with soap and water to reduce your exposure to viruses and other germs. If soap and water are not available, use hand sanitizer.  Do not smoke. Avoid being  around people who are smoking (secondhand smoke).  Keep all follow-up visits as told by your health care provider. This is important. Contact a health care provider if:  You have a fever.  Your symptoms get worse.  Your symptoms do not improve within 10 days. Get help right away if:  You have a severe headache.  You have persistent vomiting.  You have pain or swelling around your face or eyes.  You have vision problems.  You develop confusion.  Your neck is stiff.  You have trouble breathing. This information is not intended to replace advice given to you by your health care provider. Make sure you discuss any questions you have with your health care provider. Document Released: 04/30/2005 Document Revised: 12/25/2015 Document Reviewed: 02/23/2015 Elsevier Interactive Patient Education  2017 [employer].   IF you received an x-ray today, you will receive an invoice from [facility]. Please contact [facility] at [phone] with questions or concerns regarding your invoice.   IF you received labwork today, you will receive an invoice from [facility]. Please contact  [facility] at [phone] with questions or concerns regarding your invoice.   Our billing staff will not be able to assist you with questions regarding bills from these companies.  You will be contacted with the lab results as soon as they are available. The fastest way to get your results is to activate your My Chart account. Instructions are located on the last page of this paperwork. If you have not heard from [place] regarding the results in 2 weeks, please contact this office.

## 2017-04-30 NOTE — Progress Notes (Signed)
12/18/20185:38 PM  Kevin Garza [birth year], 46 y.o. male [id number]  Chief Complaint  Patient presents with  . Facial Pain    x2weeks   . Sore Throat  . Cough  . Nasal Congestion    HPI:   Patient is a 46 y.o. male with past medical history significant for environmental allergies to mold, roaches and dust who presents today for intermittent clear rhinorrhea, sinus and ear pressure, sore throat, headache for past 2 weeks. Denies any fever or chills. Denies any SOB. Patient does smoke. He has been taking zyrtec and ibuprofen as needed. Children at home have had a cold. Last sinusitis was Oct 2017. Does not have h/o sinus surgery.   Depression screen [facility] 2/9 04/30/2017 02/24/2016  Decreased Interest 0 0  Down, Depressed, Hopeless 0 0  PHQ - 2 Score 0 0    No Known Allergies  Prior to Admission medications   Medication Sig Start Date End Date Taking? Authorizing Provider  cetirizine (ZYRTEC) 10 MG tablet Take 10 mg by mouth daily.   Yes [provider]  fluticasone (FLONASE) 50 MCG/ACT nasal spray Place 2 sprays into the nose daily. 07/13/12  Yes Darlyne Russian, MD  fluticasone (FLONASE) 50 MCG/ACT nasal spray Place 2 sprays into both nostrils daily. Patient not taking: Reported on 04/30/2017 02/24/16   Wardell Honour, MD    Past Medical History:  Diagnosis Date  . Allergy     History reviewed. No pertinent surgical history.  Social History   Tobacco Use  . Smoking status: Current Every Day Smoker    Packs/day: 1.00    Years: 15.00    Pack years: 15.00    Types: Cigarettes  . Smokeless tobacco: Never Used  Substance Use Topics  . Alcohol use: Yes    Alcohol/week: 3.6 oz    Types: 6 Standard drinks or equivalent per week    Family History  Problem Relation Age of Onset  . Cancer Mother   . Hyperlipidemia Father     ROS Per hpi  OBJECTIVE:  Blood pressure 122/80, pulse 74, temperature 98.7 F (37.1 C), temperature source Oral, resp. rate 18,  height 5' 8"  (1.727 m), weight 170 lb 6.4 oz (77.3 kg), SpO2 98 %.  Physical Exam  Constitutional: He is oriented to person, place, and time and well-developed, well-nourished, and in no distress.  HENT:  Head: Normocephalic and atraumatic.  Right Ear: Hearing, tympanic membrane, external ear and ear canal normal.  Left Ear: Hearing, tympanic membrane, external ear and ear canal normal.  Nose: Mucosal edema and rhinorrhea present. Epistaxis (area of crusted blood noted anterior nares) is observed. Right sinus exhibits maxillary sinus tenderness. Right sinus exhibits no frontal sinus tenderness. Left sinus exhibits no maxillary sinus tenderness and no frontal sinus tenderness.  Mouth/Throat: Oropharynx is clear and moist. No oropharyngeal exudate.  Eyes: Conjunctivae and EOM are normal. Pupils are equal, round, and reactive to light.  Neck: Neck supple.  Cardiovascular: Normal rate and regular rhythm. Exam reveals no gallop and no friction rub.  No murmur heard. Pulmonary/Chest: Effort normal and breath sounds normal. He has no wheezes. He has no rales.  Lymphadenopathy:    He has no cervical adenopathy.  Neurological: He is alert and oriented to person, place, and time. Gait normal.  Skin: Skin is warm and dry.     ASSESSMENT and PLAN 1. Acute non-recurrent maxillary sinusitis Discussed supportive measures, new meds, delayed antibiotic prescription and RTC precautions. Patient educational handout given. -  fluticasone (FLONASE) 50 MCG/ACT nasal spray; Place 2 sprays into both nostrils daily.  Other orders - amoxicillin-clavulanate (AUGMENTIN) 875-125 MG tablet; Take 1 tablet by mouth 2 (two) times daily.  Return if symptoms worsen or fail to improve.    Rutherford Guys, MD Primary Care at [address], [place] [address] Ph.  [phone] Fax [phone]

## 2017-09-03 ENCOUNTER — Telehealth: Payer: Self-pay

## 2017-09-03 NOTE — Telephone Encounter (Signed)
Copied from [id number]. Topic: Bill or Statement - Patient/Guarantor Inquiry >> Sep 03, 2017  3:36 PM Aurelio Brash B wrote: Marita Kansas from [place] called to say the pts bill of $82.95  from [facility] 04/30/17  was paid with check # [id number] on 12/26   Marita Kansas also  request that office call pt to let him know the bill has been paid.  Kristy's contact number is [phone]  Will automatically be routed to [facility] Coding pool.

## 2017-10-03 ENCOUNTER — Encounter: Payer: Self-pay | Admitting: Family Medicine

## 2019-05-18 ENCOUNTER — Other Ambulatory Visit: Payer: Self-pay

## 2019-05-18 ENCOUNTER — Ambulatory Visit (INDEPENDENT_AMBULATORY_CARE_PROVIDER_SITE_OTHER): Payer: BC Managed Care – PPO | Admitting: Family Medicine

## 2019-05-18 VITALS — BP 124/78 | HR 74 | Temp 98.4°F | Ht 68.0 in | Wt 164.0 lb

## 2019-05-18 DIAGNOSIS — S39012A Strain of muscle, fascia and tendon of lower back, initial encounter: Secondary | ICD-10-CM

## 2019-05-18 DIAGNOSIS — S76212A Strain of adductor muscle, fascia and tendon of left thigh, initial encounter: Secondary | ICD-10-CM

## 2019-05-18 DIAGNOSIS — J309 Allergic rhinitis, unspecified: Secondary | ICD-10-CM

## 2019-05-18 DIAGNOSIS — M542 Cervicalgia: Secondary | ICD-10-CM

## 2019-05-18 MED ORDER — MELOXICAM 7.5 MG PO TABS: 7.5000 mg | ORAL_TABLET | Freq: Every day | ORAL | 0 refills | Status: AC | PRN

## 2019-05-18 NOTE — Patient Instructions (Addendum)
Start back on flonase nasal spray and zyrtec for allergies. If that is not helping your sinus or neck symptoms in next week (or any worse sooner) return for recheck.   You likely have groin strain and low back strain. Try mobic once per day (do not combine with ibuprofen, but tylenol is ok). Heat or ice can help as well. Advise your employer of your injury as you may need to be seen under workers' comp.   Adductor Muscle Strain  An adductor muscle strain, also called a groin strain or pull, is an injury to the muscles or tendons on the upper, inner part of the thigh. These muscles are called the adductor muscles or groin muscles. They are responsible for moving the legs across the body or pulling the legs together. A muscle strain occurs when a muscle is overstretched and some muscle fibers are torn. An adductor muscle strain can range from mild to severe, depending on how many muscle fibers are affected and whether the muscle fibers are partially or completely torn. What are the causes? Adductor muscle strains usually occur during exercise or while participating in sports. The injury often happens when a sudden, violent force is placed on a muscle, stretching the muscle too far. A strain is more likely to happen when your muscles are not warmed up or if you are not properly conditioned. This injury may be caused by:  Stretching the adductor muscles too far or too suddenly, often during side-to-side motion with a sudden change in direction.  Putting repeated stress on the adductor muscles over a long period of time.  Performing vigorous activity without properly stretching the adductor muscles beforehand. What are the signs or symptoms? Symptoms of this condition include:  Pain and tenderness in the groin area. This begins as sharp pain and persists as a dull ache.  A popping or snapping feeling when the injury occurs (for severe strains).  Swelling or bruising.  Muscle  spasms.  Weakness in the leg.  Stiffness in the groin area with decreased ability to move the affected muscles. How is this diagnosed? This condition may be diagnosed based on:  Your symptoms and a description of how the injury occurred.  A physical exam.  Imaging tests, such as: ? X-rays. These are sometimes needed to rule out a broken bone or cartilage problems. ? An ultrasound, CT scan, or MRI. These may be done if your health care provider suspects a complete muscle tear or needs to check for other injuries. How is this treated? An adductor strain will often heal on its own. If needed, this condition may be treated with:  PRICE therapy. PRICE stands for protection of the injured area, rest, ice, pressure (compression), and elevation.  Medicines to help manage pain and swelling (anti-inflammatory medicines).  Crutches. You may be directed to use these for the first few days to minimize your pain. Depending on the severity of the muscle strain, recovery time may vary from a few weeks to several months. Severe injuries often require 4-6 weeks for recovery. In those cases, complete healing can take 4-5 months. Follow these instructions at home: [place] the muscle from being injured again.  Rest. Do not use the strained muscle if it causes pain.  If directed, put ice on the injured area: ? Put ice in a plastic bag. ? Place a towel between your skin and the bag. ? Leave the ice on for 20 minutes, 2-3 times a day. Do this  for the first 2 days after the injury.  Apply compression by wrapping the injured area with an elastic bandage as told by your health care provider.  Raise (elevate) the injured area above the level of your heart while you are sitting or lying down. General instructions  Take over-the-counter and prescription medicines only as told by your health care provider.  Walk, stretch, and do exercises as told by your health care provider. Only do  these activities if you can do so without any pain.  Follow your treatment plan as told by your health care provider. This may include: ? Physical therapy. ? Massage. ? Local electrical stimulation (transcutaneous electrical nerve stimulation, TENS). How is this prevented?  Warm up and stretch before being active.  Cool down and stretch after being active.  Give your body time to rest between periods of activity.  Make sure to use equipment that fits you.  Be safe and responsible while being active to avoid slips and falls.  Maintain physical fitness, including: ? Proper conditioning in the adductor muscles. ? Overall strength, flexibility, and endurance. Contact a health care provider if:  You have increased pain or swelling in the affected area.  Your symptoms are not improving or they are getting worse. Summary  An adductor muscle strain, also called a groin strain or pull, is an injury to the muscles or tendons on the upper, inner part of the thigh.  A muscle strain occurs when a muscle is overstretched and some muscle fibers are torn.  Depending on the severity of the muscle strain, recovery time may vary from a few weeks to several months. This information is not intended to replace advice given to you by your health care provider. Make sure you discuss any questions you have with your health care provider. Document Revised: 08/19/2018 Document Reviewed: 09/30/2017 Elsevier Patient Education  [place].  Acute Back Pain, Adult Acute back pain is sudden and usually short-lived. It is often caused by an injury to the muscles and tissues in the back. The injury may result from:  A muscle or ligament getting overstretched or torn (strained). Ligaments are tissues that connect bones to each other. Lifting something improperly can cause a back strain.  Wear and tear (degeneration) of the spinal disks. Spinal disks are circular tissue that provides cushioning between  the bones of the spine (vertebrae).  Twisting motions, such as while playing sports or doing yard work.  A hit to the back.  Arthritis. You may have a physical exam, lab tests, and imaging tests to find the cause of your pain. Acute back pain usually goes away with rest and home care. Follow these instructions at home: Managing pain, stiffness, and swelling  Take over-the-counter and prescription medicines only as told by your health care provider.  Your health care provider may recommend applying ice during the first 24-48 hours after your pain starts. To do this: ? Put ice in a plastic bag. ? Place a towel between your skin and the bag. ? Leave the ice on for 20 minutes, 2-3 times a day.  If directed, apply heat to the affected area as often as told by your health care provider. Use the heat source that your health care provider recommends, such as a moist heat pack or a heating pad. ? Place a towel between your skin and the heat source. ? Leave the heat on for 20-30 minutes. ? Remove the heat if your skin turns bright red. This is  especially important if you are unable to feel pain, heat, or cold. You have a greater risk of getting burned. Activity   Do not stay in bed. Staying in bed for more than 1-2 days can delay your recovery.  Sit up and stand up straight. Avoid leaning forward when you sit, or hunching over when you stand. ? If you work at a desk, sit close to it so you do not need to lean over. Keep your chin tucked in. Keep your neck drawn back, and keep your elbows bent at a right angle. Your arms should look like the letter "L." ? Sit high and close to the steering wheel when you drive. Add lower back (lumbar) support to your car seat, if needed.  Take short walks on even surfaces as soon as you are able. Try to increase the length of time you walk each day.  Do not sit, drive, or stand in one place for more than 30 minutes at a time. Sitting or standing for long  periods of time can put stress on your back.  Do not drive or use heavy machinery while taking prescription pain medicine.  Use proper lifting techniques. When you bend and lift, use positions that put less stress on your back: ? [place] your knees. ? Keep the load close to your body. ? Avoid twisting.  Exercise regularly as told by your health care provider. Exercising helps your back heal faster and helps prevent back injuries by keeping muscles strong and flexible.  Work with a physical therapist to make a safe exercise program, as recommended by your health care provider. Do any exercises as told by your physical therapist. Lifestyle  Maintain a healthy weight. Extra weight puts stress on your back and makes it difficult to have good posture.  Avoid activities or situations that make you feel anxious or stressed. Stress and anxiety increase muscle tension and can make back pain worse. Learn ways to manage anxiety and stress, such as through exercise. General instructions  Sleep on a firm mattress in a comfortable position. Try lying on your side with your knees slightly bent. If you lie on your back, put a pillow under your knees.  Follow your treatment plan as told by your health care provider. This may include: ? Cognitive or behavioral therapy. ? Acupuncture or massage therapy. ? Meditation or yoga. Contact a health care provider if:  You have pain that is not relieved with rest or medicine.  You have increasing pain going down into your legs or buttocks.  Your pain does not improve after 2 weeks.  You have pain at night.  You lose weight without trying.  You have a fever or chills. Get help right away if:  You develop new bowel or bladder control problems.  You have unusual weakness or numbness in your arms or legs.  You develop nausea or vomiting.  You develop abdominal pain.  You feel faint. Summary  Acute back pain is sudden and usually short-lived.  Use  proper lifting techniques. When you bend and lift, use positions that put less stress on your back.  Take over-the-counter and prescription medicines and apply heat or ice as directed by your health care provider. This information is not intended to replace advice given to you by your health care provider. Make sure you discuss any questions you have with your health care provider. Document Revised: 08/19/2018 Document Reviewed: 12/12/2016 Elsevier Patient Education  [place].  Return to the clinic or go to the nearest emergency room if any of your symptoms worsen or new symptoms occur.   If you have lab work done today you will be contacted with your lab results within the next 2 weeks.  If you have not heard from [place] then please contact us. The fastest way to get your results is to register for My Chart.   IF you received an x-ray today, you will receive an invoice from [facility]. Please contact [facility] at [phone] with questions or concerns regarding your invoice.   IF you received labwork today, you will receive an invoice from [facility]. Please contact [facility] at [phone] with questions or concerns regarding your invoice.   Our billing staff will not be able to assist you with questions regarding bills from these companies.  You will be contacted with the lab results as soon as they are available. The fastest way to get your results is to activate your My Chart account. Instructions are located on the last page of this paperwork. If you have not heard from [place] regarding the results in 2 weeks, please contact this office.

## 2019-05-18 NOTE — Progress Notes (Signed)
Subjective:  Patient ID: Kevin Garza, male    DOB: 1970-09-11  Age: 49 y.o. MRN: [id number]  CC:  Chief Complaint  Patient presents with  . Leg Pain    L leg pain. pain starts from the top of the thigh and gos down to the top of the knee cap. pain started on 05/15/2019  . Facial Pain    started about4-5 days ago. sinus areas of the face and ears. lots fo pressure.     HPI Kevin Garza presents for   L leg pain: Inside of left thigh, started about 3 days ago at work - noticed after pushing gate, felt pop on top of knee, but no initial pain. Next day - sore in inside groin and top on knee. No swelling, able to weight bear. Sore to sit.  Some soreness in low back as well. 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 Has not be seen uder workers comp.  Feels some better today.  No PUD/renal disease.   Tx: ibuprofen - 2 pills BID, has helped some.   Face pain: Past 5-6 days.  Under jaws, behind ear, notices with swallowing. Some pressure in face. Hx of allergies - not using nasal spray or allergy meds recently - few months ago.  No fever.  Drinking fluids ok, no pain with chewing - just to swallow, with some poppinng in ears at times, postnasal drip.  Clear nasal d/c.   History Patient Active Problem List   Diagnosis Date Noted  . Acute seasonal allergic rhinitis due to pollen 02/24/2016   Past Medical History:  Diagnosis Date  . Allergy    No past surgical history on file. No Known Allergies Prior to Admission medications   Medication Sig Start Date End Date Taking? Authorizing Provider  amoxicillin-clavulanate (AUGMENTIN) 875-125 MG tablet Take 1 tablet by mouth 2 (two) times daily. Patient not taking: Reported on 05/18/2019 04/30/17   Rutherford Guys, MD  cetirizine (ZYRTEC) 10 MG tablet Take 10 mg by mouth daily.    [provider]  fluticasone (FLONASE) 50 MCG/ACT nasal spray Place 2 sprays into both nostrils daily. Patient not taking: Reported on 04/30/2017 02/24/16    Wardell Honour, MD  fluticasone [facility]) 50 MCG/ACT nasal spray Place 2 sprays into both nostrils daily. Patient not taking: Reported on 05/18/2019 04/30/17   Rutherford Guys, MD   Social History   Socioeconomic History  . Marital status: Married    Spouse name: Not on file  . Number of children: Not on file  . Years of education: Not on file  . Highest education level: Not on file  Occupational History  . Not on file  Tobacco Use  . Smoking status: Current Every Day Smoker    Packs/day: 1.00    Years: 15.00    Pack years: 15.00    Types: Cigarettes  . Smokeless tobacco: Never Used  Substance and Sexual Activity  . Alcohol use: Yes    Alcohol/week: 6.0 standard drinks    Types: 6 Standard drinks or equivalent per week  . Drug use: No  . Sexual activity: Not on file  Other Topics Concern  . Not on file  Social History Narrative  . Not on file   Social Determinants of Health   Financial Resource Strain:   . Difficulty of Paying Living Expenses: Not on file  Food Insecurity:   . Worried About Charity fundraiser in the Last Year: Not on file  . Ran Out  of Food in the Last Year: Not on file  Transportation Needs:   . Lack of Transportation (Medical): Not on file  . Lack of Transportation (Non-Medical): Not on file  Physical Activity:   . Days of Exercise per Week: Not on file  . Minutes of Exercise per Session: Not on file  Stress:   . Feeling of Stress : Not on file  Social Connections:   . Frequency of Communication with Friends and Family: Not on file  . Frequency of Social Gatherings with Friends and Family: Not on file  . Attends Religious Services: Not on file  . Active Member of Clubs or Organizations: Not on file  . Attends Archivist Meetings: Not on file  . Marital Status: Not on file  Intimate Partner Violence:   . Fear of Current or Ex-Partner: Not on file  . Emotionally Abused: Not on file  . Physically Abused: Not on file  . Sexually  Abused: Not on file    Review of Systems Per HPI  Objective:   Vitals:   05/18/19 1517  BP: 124/78  Pulse: 74  Temp: 98.4 F (36.9 C)  TempSrc: Temporal  SpO2: 98%  Weight: 164 lb (74.4 kg)  Height: 5' 8"  (1.727 m)     Physical Exam Constitutional:      General: He is not in acute distress.    Appearance: He is well-developed.  HENT:     Head: Normocephalic and atraumatic.     Jaw: No tenderness or pain on movement (nontender, no pop/click appreciated at tmj. ).     Salivary Glands: Right salivary gland is not diffusely enlarged. Left salivary gland is tender (mild ttp submandibular, no LAD appreciated. ). Left salivary gland is not diffusely enlarged.     Right Ear: Tympanic membrane, ear canal and external ear normal.     Left Ear: Tympanic membrane, ear canal and external ear normal.     Mouth/Throat:     Mouth: Mucous membranes are moist.     Pharynx: No oropharyngeal exudate or posterior oropharyngeal erythema.  Cardiovascular:     Rate and Rhythm: Normal rate.  Pulmonary:     Effort: Pulmonary effort is normal.  Musculoskeletal:     Lumbar back: Spasms and tenderness (Left paraspinals, no midline bony tenderness.  Flexion to approximately 80 degrees, limited right lateral flexion due to left paraspinal discomfort.  Negative seated straight leg raise, able to heel and toe walk without difficulty.) present. No bony tenderness. Negative right straight leg raise test and negative left straight leg raise test.     Left hip: No tenderness or bony tenderness (Discomfort at medial thigh/abductor musculature with motion, no groin pain/hip pain with internal/external rotation/motion.  No focal bony tenderness.). Normal strength.     Left knee: Normal. No swelling, deformity, effusion, erythema or bony tenderness. Normal range of motion. No tenderness. No LCL laxity or MCL laxity.Normal meniscus and normal patellar mobility.     Left lower leg: No swelling.  Neurological:      Mental Status: He is alert and oriented to person, place, and time.      Assessment & Plan:  Kevin Garza is a 49 y.o. male . Inguinal strain, left, initial encounter - Plan: meloxicam (MOBIC) 7.5 MG tablet Strain of lumbar region, initial encounter - Plan: meloxicam (MOBIC) 7.5 MG tablet  -Suspect groin strain, low back strain after injury few days prior.  Slight improvement from initial symptoms.  No weakness.  Reassuring exam.  Deferred imaging based on exam and symptoms at present.  Symptomatic care discussed with meloxicam short-term, then follow-up if not improving.  Note provided for work.    Allergic rhinitis, unspecified seasonality, unspecified trigger Neck pain on left side  -Reports postnasal drip, some sinus pressure along with the left-sided neck pain.  Possibly more in area of salivary gland but I do not appreciate a significant enlargement or true lymphadenopathy.  Clearing secretions normally, afebrile, posterior oropharynx appears reassuring, doubt retropharyngeal abscess.   -Restart usual allergy meds, with RTC/ER precautions if not improving or acute worsening.  Understanding expressed.  Meds ordered this encounter  Medications  . meloxicam (MOBIC) 7.5 MG tablet    Sig: Take 1 tablet (7.5 mg total) by mouth daily as needed for pain.    Dispense:  14 tablet    Refill:  0   Patient Instructions    Start back on flonase nasal spray and zyrtec for allergies. If that is not helping your sinus or neck symptoms in next week (or any worse sooner) return for recheck.   You likely have groin strain and low back strain. Try mobic once per day (do not combine with ibuprofen, but tylenol is ok). Heat or ice can help as well. Advise your employer of your injury as you may need to be seen under workers' comp.   Adductor Muscle Strain  An adductor muscle strain, also called a groin strain or pull, is an injury to the muscles or tendons on the upper, inner part of the thigh.  These muscles are called the adductor muscles or groin muscles. They are responsible for moving the legs across the body or pulling the legs together. A muscle strain occurs when a muscle is overstretched and some muscle fibers are torn. An adductor muscle strain can range from mild to severe, depending on how many muscle fibers are affected and whether the muscle fibers are partially or completely torn. What are the causes? Adductor muscle strains usually occur during exercise or while participating in sports. The injury often happens when a sudden, violent force is placed on a muscle, stretching the muscle too far. A strain is more likely to happen when your muscles are not warmed up or if you are not properly conditioned. This injury may be caused by:  Stretching the adductor muscles too far or too suddenly, often during side-to-side motion with a sudden change in direction.  Putting repeated stress on the adductor muscles over a long period of time.  Performing vigorous activity without properly stretching the adductor muscles beforehand. What are the signs or symptoms? Symptoms of this condition include:  Pain and tenderness in the groin area. This begins as sharp pain and persists as a dull ache.  A popping or snapping feeling when the injury occurs (for severe strains).  Swelling or bruising.  Muscle spasms.  Weakness in the leg.  Stiffness in the groin area with decreased ability to move the affected muscles. How is this diagnosed? This condition may be diagnosed based on:  Your symptoms and a description of how the injury occurred.  A physical exam.  Imaging tests, such as: ? X-rays. These are sometimes needed to rule out a broken bone or cartilage problems. ? An ultrasound, CT scan, or MRI. These may be done if your health care provider suspects a complete muscle tear or needs to check for other injuries. How is this treated? An adductor strain will often heal on its  own. If needed, this  condition may be treated with:  PRICE therapy. PRICE stands for protection of the injured area, rest, ice, pressure (compression), and elevation.  Medicines to help manage pain and swelling (anti-inflammatory medicines).  Crutches. You may be directed to use these for the first few days to minimize your pain. Depending on the severity of the muscle strain, recovery time may vary from a few weeks to several months. Severe injuries often require 4-6 weeks for recovery. In those cases, complete healing can take 4-5 months. Follow these instructions at home: [place] the muscle from being injured again.  Rest. Do not use the strained muscle if it causes pain.  If directed, put ice on the injured area: ? Put ice in a plastic bag. ? Place a towel between your skin and the bag. ? Leave the ice on for 20 minutes, 2-3 times a day. Do this for the first 2 days after the injury.  Apply compression by wrapping the injured area with an elastic bandage as told by your health care provider.  Raise (elevate) the injured area above the level of your heart while you are sitting or lying down. General instructions  Take over-the-counter and prescription medicines only as told by your health care provider.  Walk, stretch, and do exercises as told by your health care provider. Only do these activities if you can do so without any pain.  Follow your treatment plan as told by your health care provider. This may include: ? Physical therapy. ? Massage. ? Local electrical stimulation (transcutaneous electrical nerve stimulation, TENS). How is this prevented?  Warm up and stretch before being active.  Cool down and stretch after being active.  Give your body time to rest between periods of activity.  Make sure to use equipment that fits you.  Be safe and responsible while being active to avoid slips and falls.  Maintain physical fitness, including: ? Proper  conditioning in the adductor muscles. ? Overall strength, flexibility, and endurance. Contact a health care provider if:  You have increased pain or swelling in the affected area.  Your symptoms are not improving or they are getting worse. Summary  An adductor muscle strain, also called a groin strain or pull, is an injury to the muscles or tendons on the upper, inner part of the thigh.  A muscle strain occurs when a muscle is overstretched and some muscle fibers are torn.  Depending on the severity of the muscle strain, recovery time may vary from a few weeks to several months. This information is not intended to replace advice given to you by your health care provider. Make sure you discuss any questions you have with your health care provider. Document Revised: 08/19/2018 Document Reviewed: 09/30/2017 Elsevier Patient Education  [place].  Acute Back Pain, Adult Acute back pain is sudden and usually short-lived. It is often caused by an injury to the muscles and tissues in the back. The injury may result from:  A muscle or ligament getting overstretched or torn (strained). Ligaments are tissues that connect bones to each other. Lifting something improperly can cause a back strain.  Wear and tear (degeneration) of the spinal disks. Spinal disks are circular tissue that provides cushioning between the bones of the spine (vertebrae).  Twisting motions, such as while playing sports or doing yard work.  A hit to the back.  Arthritis. You may have a physical exam, lab tests, and imaging tests to find the cause of your pain. Acute  back pain usually goes away with rest and home care. Follow these instructions at home: Managing pain, stiffness, and swelling  Take over-the-counter and prescription medicines only as told by your health care provider.  Your health care provider may recommend applying ice during the first 24-48 hours after your pain starts. To do this: ? Put ice  in a plastic bag. ? Place a towel between your skin and the bag. ? Leave the ice on for 20 minutes, 2-3 times a day.  If directed, apply heat to the affected area as often as told by your health care provider. Use the heat source that your health care provider recommends, such as a moist heat pack or a heating pad. ? Place a towel between your skin and the heat source. ? Leave the heat on for 20-30 minutes. ? Remove the heat if your skin turns bright red. This is especially important if you are unable to feel pain, heat, or cold. You have a greater risk of getting burned. Activity   Do not stay in bed. Staying in bed for more than 1-2 days can delay your recovery.  Sit up and stand up straight. Avoid leaning forward when you sit, or hunching over when you stand. ? If you work at a desk, sit close to it so you do not need to lean over. Keep your chin tucked in. Keep your neck drawn back, and keep your elbows bent at a right angle. Your arms should look like the letter "L." ? Sit high and close to the steering wheel when you drive. Add lower back (lumbar) support to your car seat, if needed.  Take short walks on even surfaces as soon as you are able. Try to increase the length of time you walk each day.  Do not sit, drive, or stand in one place for more than 30 minutes at a time. Sitting or standing for long periods of time can put stress on your back.  Do not drive or use heavy machinery while taking prescription pain medicine.  Use proper lifting techniques. When you bend and lift, use positions that put less stress on your back: ? [place] your knees. ? Keep the load close to your body. ? Avoid twisting.  Exercise regularly as told by your health care provider. Exercising helps your back heal faster and helps prevent back injuries by keeping muscles strong and flexible.  Work with a physical therapist to make a safe exercise program, as recommended by your health care provider. Do any  exercises as told by your physical therapist. Lifestyle  Maintain a healthy weight. Extra weight puts stress on your back and makes it difficult to have good posture.  Avoid activities or situations that make you feel anxious or stressed. Stress and anxiety increase muscle tension and can make back pain worse. Learn ways to manage anxiety and stress, such as through exercise. General instructions  Sleep on a firm mattress in a comfortable position. Try lying on your side with your knees slightly bent. If you lie on your back, put a pillow under your knees.  Follow your treatment plan as told by your health care provider. This may include: ? Cognitive or behavioral therapy. ? Acupuncture or massage therapy. ? Meditation or yoga. Contact a health care provider if:  You have pain that is not relieved with rest or medicine.  You have increasing pain going down into your legs or buttocks.  Your pain does not improve after 2 weeks.  You have pain at night.  You lose weight without trying.  You have a fever or chills. Get help right away if:  You develop new bowel or bladder control problems.  You have unusual weakness or numbness in your arms or legs.  You develop nausea or vomiting.  You develop abdominal pain.  You feel faint. Summary  Acute back pain is sudden and usually short-lived.  Use proper lifting techniques. When you bend and lift, use positions that put less stress on your back.  Take over-the-counter and prescription medicines and apply heat or ice as directed by your health care provider. This information is not intended to replace advice given to you by your health care provider. Make sure you discuss any questions you have with your health care provider. Document Revised: 08/19/2018 Document Reviewed: 12/12/2016 Elsevier Patient Education  2020 [employer].      Return to the clinic or go to the nearest emergency room if any of your symptoms worsen or  new symptoms occur.   If you have lab work done today you will be contacted with your lab results within the next 2 weeks.  If you have not heard from [place] then please contact us. The fastest way to get your results is to register for My Chart.   IF you received an x-ray today, you will receive an invoice from [facility]. Please contact [facility] at [phone] with questions or concerns regarding your invoice.   IF you received labwork today, you will receive an invoice from [facility]. Please contact [facility] at [phone] with questions or concerns regarding your invoice.   Our billing staff will not be able to assist you with questions regarding bills from these companies.  You will be contacted with the lab results as soon as they are available. The fastest way to get your results is to activate your My Chart account. Instructions are located on the last page of this paperwork. If you have not heard from [place] regarding the results in 2 weeks, please contact this office.         Signed, Merri Ray, MD [facility]

## 2019-05-19 ENCOUNTER — Encounter: Payer: Self-pay | Admitting: Family Medicine

## 2019-12-06 ENCOUNTER — Emergency Department (HOSPITAL_BASED_OUTPATIENT_CLINIC_OR_DEPARTMENT_OTHER)
Admission: EM | Admit: 2019-12-06 | Discharge: 2019-12-06 | Disposition: A | Discharge status: Home / Self Care | Payer: BC Managed Care – PPO | Attending: Emergency Medicine | Admitting: Emergency Medicine

## 2019-12-06 ENCOUNTER — Encounter (HOSPITAL_BASED_OUTPATIENT_CLINIC_OR_DEPARTMENT_OTHER): Payer: Self-pay | Admitting: Emergency Medicine

## 2019-12-06 ENCOUNTER — Other Ambulatory Visit: Payer: Self-pay

## 2019-12-06 DIAGNOSIS — R109 Unspecified abdominal pain: Secondary | ICD-10-CM | POA: Diagnosis not present

## 2019-12-06 DIAGNOSIS — R200 Anesthesia of skin: Secondary | ICD-10-CM | POA: Insufficient documentation

## 2019-12-06 DIAGNOSIS — M25562 Pain in left knee: Secondary | ICD-10-CM | POA: Insufficient documentation

## 2019-12-06 DIAGNOSIS — M25561 Pain in right knee: Secondary | ICD-10-CM | POA: Insufficient documentation

## 2019-12-06 DIAGNOSIS — F17210 Nicotine dependence, cigarettes, uncomplicated: Secondary | ICD-10-CM | POA: Diagnosis not present

## 2019-12-06 DIAGNOSIS — M545 Low back pain: Secondary | ICD-10-CM | POA: Insufficient documentation

## 2019-12-06 MED ORDER — PREDNISONE 20 MG PO TABS: 40.0000 mg | ORAL_TABLET | Freq: Every day | ORAL | 0 refills | Status: AC

## 2019-12-06 MED ORDER — HYDROCODONE-ACETAMINOPHEN 5-325 MG PO TABS: 1.0000 | ORAL_TABLET | Freq: Four times a day (QID) | ORAL | 0 refills | Status: AC | PRN

## 2019-12-06 NOTE — Discharge Instructions (Addendum)
Take the medicine to help with the pain.  Follow-up with sports medicine as needed if the symptoms do not improve.

## 2019-12-06 NOTE — ED Provider Notes (Signed)
[facility] EMERGENCY DEPARTMENT Provider Note   CSN: [id number] Arrival date & time: 12/06/19  6256     History Chief Complaint  Patient presents with  . Back Pain  . Knee Pain    Kevin Garza is a 49 y.o. male.  HPI Patient presents with pain in his bilateral knees.  Began around 4 days ago.  States he thinks it may be from wearing his old shoes and working too much.  States pain in both knees is worse with sitting still and better with walking around.  States he has had very little sleep because of it.  Really not having much back pain but does have a history of ankylosing spondylitis.  States he does have some chronic numbness down the middle of his thighs which is really unchanged.  States he is getting up at night walking around because it seems to make the pain in his knees better.  States he has some pain in the arches of his foot but states he has flat feet so that is not unusual for him.  He has been taking Motrin with temporary relief of the pain but also states at times it does not seem to help much.    Past Medical History:  Diagnosis Date  . Allergy   Ankylosing spondylitis  Patient Active Problem List   Diagnosis Date Noted  . Acute seasonal allergic rhinitis due to pollen 02/24/2016    History reviewed. No pertinent surgical history.     Family History  Problem Relation Age of Onset  . Cancer Mother   . Hyperlipidemia Father     Social History   Tobacco Use  . Smoking status: Current Every Day Smoker    Packs/day: 1.00    Years: 15.00    Pack years: 15.00    Types: Cigarettes  . Smokeless tobacco: Never Used  Substance Use Topics  . Alcohol use: Yes    Alcohol/week: 6.0 standard drinks    Types: 6 Standard drinks or equivalent per week  . Drug use: No    Home Medications Prior to Admission medications   Medication Sig Start Date End Date Taking? Authorizing Provider  amoxicillin-clavulanate (AUGMENTIN) 875-125 MG tablet Take 1  tablet by mouth 2 (two) times daily. Patient not taking: Reported on 05/18/2019 04/30/17   Rutherford Guys, MD  cetirizine (ZYRTEC) 10 MG tablet Take 10 mg by mouth daily.    [provider]  fluticasone (FLONASE) 50 MCG/ACT nasal spray Place 2 sprays into both nostrils daily. Patient not taking: Reported on 04/30/2017 02/24/16   Wardell Honour, MD  fluticasone [facility]) 50 MCG/ACT nasal spray Place 2 sprays into both nostrils daily. Patient not taking: Reported on 05/18/2019 04/30/17   Rutherford Guys, MD  HYDROcodone-acetaminophen (NORCO/VICODIN) 5-325 MG tablet Take 1 tablet by mouth every 6 (six) hours as needed. 12/06/19   Davonna Belling, MD  meloxicam (MOBIC) 7.5 MG tablet Take 1 tablet (7.5 mg total) by mouth daily as needed for pain. 05/18/19   Wendie Agreste, MD  predniSONE (DELTASONE) 20 MG tablet Take 2 tablets (40 mg total) by mouth daily. 12/06/19   Davonna Belling, MD    Allergies    Patient has no known allergies.  Review of Systems   Review of Systems  Constitutional: Negative for appetite change.  Gastrointestinal: Positive for abdominal pain.  Musculoskeletal: Positive for back pain.       Bilateral knee pain.  Skin: Negative for rash and wound.  Neurological:  Negative for weakness.    Physical Exam Updated Vital Signs BP (!) 181/103   Pulse 54   Temp 97.7 F (36.5 C)   Resp 18   SpO2 100%   Physical Exam Vitals and nursing note reviewed.  Cardiovascular:     Rate and Rhythm: Regular rhythm.  Musculoskeletal:     Comments: Minimal lumbar tenderness.  Good range of motion bilateral knees.  Increased pain with hyperextension at the knee.  No edema.  Knee stable to varus and valgus strain.  No effusion.  No skin change.  Skin:    General: Skin is warm.     Capillary Refill: Capillary refill takes less than 2 seconds.  Neurological:     Mental Status: He is alert and oriented to person, place, and time.     ED Results / Procedures / Treatments    Labs (all labs ordered are listed, but only abnormal results are displayed) Labs Reviewed - No data to display  EKG None  Radiology No results found.  Procedures Procedures (including critical care time)  Medications Ordered in ED Medications - No data to display  ED Course  I have reviewed the triage vital signs and the nursing notes.  Pertinent labs & imaging results that were available during my care of the patient were reviewed by me and considered in my medical decision making (see chart for details).    MDM Rules/Calculators/A&P                          Patient with knee pain.  Generally from overuse.  Does have ankylosing spondylitis but back appears to be at the baseline.  Do not think imaging will help.  Will have sports medicine follow-up as needed.  Treat symptomatically with some pain meds and some steroids.  Discharge home. Final Clinical Impression(s) / ED Diagnoses Final diagnoses:  Acute pain of both knees    Rx / DC Orders ED Discharge Orders         Ordered    predniSONE (DELTASONE) 20 MG tablet  Daily     Discontinue  Reprint     12/06/19 0823    HYDROcodone-acetaminophen (NORCO/VICODIN) 5-325 MG tablet  Every 6 hours PRN     Discontinue  Reprint     12/06/19 0823           Davonna Belling, MD 12/06/19 [phone]

## 2019-12-06 NOTE — ED Triage Notes (Signed)
Pt here with knee and back pain since Thursday. Nothing makes it better and it hurts at rest.

## 2019-12-08 DIAGNOSIS — M4323 Fusion of spine, cervicothoracic region: Secondary | ICD-10-CM | POA: Diagnosis not present

## 2019-12-08 DIAGNOSIS — M2460 Ankylosis, unspecified joint: Secondary | ICD-10-CM | POA: Diagnosis not present

## 2019-12-08 DIAGNOSIS — I10 Essential (primary) hypertension: Secondary | ICD-10-CM | POA: Diagnosis not present

## 2019-12-14 DIAGNOSIS — M545 Low back pain: Secondary | ICD-10-CM | POA: Diagnosis not present

## 2019-12-14 DIAGNOSIS — G7249 Other inflammatory and immune myopathies, not elsewhere classified: Secondary | ICD-10-CM | POA: Diagnosis not present

## 2019-12-14 DIAGNOSIS — M542 Cervicalgia: Secondary | ICD-10-CM | POA: Diagnosis not present

## 2019-12-16 DIAGNOSIS — G7249 Other inflammatory and immune myopathies, not elsewhere classified: Secondary | ICD-10-CM | POA: Diagnosis not present

## 2020-01-08 ENCOUNTER — Encounter (HOSPITAL_BASED_OUTPATIENT_CLINIC_OR_DEPARTMENT_OTHER): Payer: Self-pay

## 2020-01-08 ENCOUNTER — Emergency Department (HOSPITAL_BASED_OUTPATIENT_CLINIC_OR_DEPARTMENT_OTHER)
Admission: EM | Admit: 2020-01-08 | Discharge: 2020-01-08 | Disposition: A | Discharge status: Home / Self Care | Payer: BC Managed Care – PPO | Attending: Emergency Medicine | Admitting: Emergency Medicine

## 2020-01-08 ENCOUNTER — Other Ambulatory Visit: Payer: Self-pay

## 2020-01-08 DIAGNOSIS — F17210 Nicotine dependence, cigarettes, uncomplicated: Secondary | ICD-10-CM | POA: Insufficient documentation

## 2020-01-08 DIAGNOSIS — Y999 Unspecified external cause status: Secondary | ICD-10-CM | POA: Diagnosis not present

## 2020-01-08 DIAGNOSIS — W25XXXA Contact with sharp glass, initial encounter: Secondary | ICD-10-CM | POA: Insufficient documentation

## 2020-01-08 DIAGNOSIS — R7309 Other abnormal glucose: Secondary | ICD-10-CM | POA: Diagnosis not present

## 2020-01-08 DIAGNOSIS — G4700 Insomnia, unspecified: Secondary | ICD-10-CM | POA: Diagnosis not present

## 2020-01-08 DIAGNOSIS — S61512A Laceration without foreign body of left wrist, initial encounter: Secondary | ICD-10-CM | POA: Insufficient documentation

## 2020-01-08 DIAGNOSIS — Y939 Activity, unspecified: Secondary | ICD-10-CM | POA: Insufficient documentation

## 2020-01-08 DIAGNOSIS — Z79899 Other long term (current) drug therapy: Secondary | ICD-10-CM | POA: Diagnosis not present

## 2020-01-08 DIAGNOSIS — Z23 Encounter for immunization: Secondary | ICD-10-CM | POA: Diagnosis not present

## 2020-01-08 DIAGNOSIS — S61511A Laceration without foreign body of right wrist, initial encounter: Secondary | ICD-10-CM

## 2020-01-08 DIAGNOSIS — Z0000 Encounter for general adult medical examination without abnormal findings: Secondary | ICD-10-CM | POA: Diagnosis not present

## 2020-01-08 DIAGNOSIS — S6992XA Unspecified injury of left wrist, hand and finger(s), initial encounter: Secondary | ICD-10-CM | POA: Diagnosis present

## 2020-01-08 DIAGNOSIS — Y929 Unspecified place or not applicable: Secondary | ICD-10-CM | POA: Diagnosis not present

## 2020-01-08 DIAGNOSIS — Z720 Tobacco use: Secondary | ICD-10-CM | POA: Diagnosis not present

## 2020-01-08 HISTORY — DX: Dorsalgia, unspecified: M54.9

## 2020-01-08 MED ORDER — BACITRACIN ZINC 500 UNIT/GM EX OINT
TOPICAL_OINTMENT | Freq: Two times a day (BID) | CUTANEOUS | Status: AC
  Administered 2020-01-08: 1 via TOPICAL

## 2020-01-08 MED ORDER — CEPHALEXIN 500 MG PO CAPS: 500.0000 mg | ORAL_CAPSULE | Freq: Three times a day (TID) | ORAL | 0 refills | Status: AC

## 2020-01-08 MED ORDER — TETANUS-DIPHTH-ACELL PERTUSSIS 5-2.5-18.5 LF-MCG/0.5 IM SUSP: 0.5000 mL | Freq: Once | INTRAMUSCULAR | Status: DC

## 2020-01-08 NOTE — ED Provider Notes (Signed)
[facility] EMERGENCY DEPARTMENT Provider Note   CSN: [id number] Arrival date & time: 01/08/20  0815     History Chief Complaint  Patient presents with  . Laceration    Kevin Garza is a 49 y.o. male.  49 year old male presents with laceration to the right wrist.  Patient states that he was on a deck last night when he tripped on one of the boards and fell and the glass in his hand broke resulting in a laceration to his right wrist.  Patient states this was an accidental injury, denies intentional harm.  Patient felt the area was not bleeding bad last night however when he woke up this morning he realized he should have the wound evaluated.  Last tetanus is unknown.  No other injuries, complaints, concerns.  Injury is approximately 12 hours old.        Past Medical History:  Diagnosis Date  . Allergy   . Back pain     Patient Active Problem List   Diagnosis Date Noted  . Acute seasonal allergic rhinitis due to pollen 02/24/2016    History reviewed. No pertinent surgical history.     Family History  Problem Relation Age of Onset  . Cancer Mother   . Hyperlipidemia Father     Social History   Tobacco Use  . Smoking status: Current Every Day Smoker    Packs/day: 1.00    Years: 15.00    Pack years: 15.00    Types: Cigarettes  . Smokeless tobacco: Never Used  Vaping Use  . Vaping Use: Never used  Substance Use Topics  . Alcohol use: Yes    Alcohol/week: 6.0 standard drinks    Types: 6 Standard drinks or equivalent per week  . Drug use: No    Home Medications Prior to Admission medications   Medication Sig Start Date End Date Taking? Authorizing Provider  amoxicillin-clavulanate (AUGMENTIN) 875-125 MG tablet Take 1 tablet by mouth 2 (two) times daily. Patient not taking: Reported on 05/18/2019 04/30/17   Rutherford Guys, MD  cephALEXin (KEFLEX) 500 MG capsule Take 1 capsule (500 mg total) by mouth 3 (three) times daily for 5 days. 01/08/20  01/13/20  Tacy Learn, PA-C  cetirizine (ZYRTEC) 10 MG tablet Take 10 mg by mouth daily.    [provider]  fluticasone (FLONASE) 50 MCG/ACT nasal spray Place 2 sprays into both nostrils daily. Patient not taking: Reported on 04/30/2017 02/24/16   Wardell Honour, MD  fluticasone [facility]) 50 MCG/ACT nasal spray Place 2 sprays into both nostrils daily. Patient not taking: Reported on 05/18/2019 04/30/17   Rutherford Guys, MD  HYDROcodone-acetaminophen (NORCO/VICODIN) 5-325 MG tablet Take 1 tablet by mouth every 6 (six) hours as needed. 12/06/19   Davonna Belling, MD  meloxicam (MOBIC) 7.5 MG tablet Take 1 tablet (7.5 mg total) by mouth daily as needed for pain. 05/18/19   Wendie Agreste, MD  predniSONE (DELTASONE) 20 MG tablet Take 2 tablets (40 mg total) by mouth daily. 12/06/19   Davonna Belling, MD    Allergies    Patient has no known allergies.  Review of Systems   Review of Systems  Constitutional: Negative for fever.  Musculoskeletal: Negative for arthralgias and myalgias.  Skin: Positive for wound.  Allergic/Immunologic: Negative for immunocompromised state.  Neurological: Negative for weakness and numbness.  Hematological: Does not bruise/bleed easily.    Physical Exam Updated Vital Signs BP 121/78 (BP Location: Left Arm)   Pulse 86   Temp  98.6 F (37 C) (Oral)   Resp 18   Ht 5' 8"  (1.727 m)   Wt 76.2 kg   SpO2 96%   BMI 25.54 kg/m   Physical Exam Vitals and nursing note reviewed.  Constitutional:      General: He is not in acute distress.    Appearance: He is well-developed. He is not diaphoretic.  HENT:     Head: Normocephalic and atraumatic.  Cardiovascular:     Pulses: Normal pulses.  Pulmonary:     Effort: Pulmonary effort is normal.  Musculoskeletal:        General: No swelling or deformity. Normal range of motion.     Right wrist: Laceration present. No swelling, deformity, bony tenderness or crepitus. Normal range of motion. Normal  pulse.     Comments: 4cm laceration to the anterior right wrist, no active bleeding, wound edges retracted. Wound is fairly superficial, does not extend beyond sub q tissue however is gapping and will require approximation. FROM, no obvious retained FB or tendon involvement when viewed through full range of motion.   Skin:    General: Skin is warm and dry.     Findings: No erythema or rash.  Neurological:     Mental Status: He is alert and oriented to person, place, and time.  Psychiatric:        Behavior: Behavior normal.     ED Results / Procedures / Treatments   Labs (all labs ordered are listed, but only abnormal results are displayed) Labs Reviewed - No data to display  EKG None  Radiology No results found.  Procedures .Marland KitchenLaceration Repair  Date/Time: 01/08/2020 10:22 AM Performed by: Tacy Learn, PA-C Authorized by: Tacy Learn, PA-C   Consent:    Consent obtained:  Verbal   Consent given by:  Patient   Risks discussed:  Infection, need for additional repair, pain, poor cosmetic result and poor wound healing   Alternatives discussed:  No treatment and delayed treatment Universal protocol:    Procedure explained and questions answered to patient or proxy's satisfaction: yes     Relevant documents present and verified: yes     Test results available and properly labeled: yes     Imaging studies available: yes     Required blood products, implants, devices, and special equipment available: yes     Site/side marked: yes     Immediately prior to procedure, a time out was called: yes     Patient identity confirmed:  Verbally with patient Anesthesia (see MAR for exact dosages):    Anesthesia method:  None Laceration details:    Location:  Shoulder/arm   Shoulder/arm location:  R lower arm   Length (cm):  4   Depth (mm):  2 Repair type:    Repair type:  Simple Pre-procedure details:    Preparation:  Patient was prepped and draped in usual sterile  fashion Exploration:    Wound exploration: wound explored through full range of motion and entire depth of wound probed and visualized     Wound extent: no foreign bodies/material noted, no muscle damage noted, no nerve damage noted and no tendon damage noted     Contaminated: no   Treatment:    Area cleansed with:  Saline   Amount of cleaning:  Extensive   Irrigation solution:  Sterile saline Skin repair:    Repair method:  Steri-Strips   Number of Steri-Strips: 2 derma clips, 1 steri strip. Approximation:    Approximation:  Loose Post-procedure details:    Dressing:  Non-adherent dressing   Patient tolerance of procedure:  Tolerated well, no immediate complications   (including critical care time)  Medications Ordered in ED Medications  bacitracin ointment (has no administration in time range)  Tdap (BOOSTRIX) injection 0.5 mL (has no administration in time range)    ED Course  I have reviewed the triage vital signs and the nursing notes.  Pertinent labs & imaging results that were available during my care of the patient were reviewed by me and considered in my medical decision making (see chart for details).  Clinical Course as of Jan 07 1025  Fri Jan 08, 6571  [id number] 49 year old male presents with laceration of right wrist which occurred 12 hours ago.  On exam, patient has a fairly superficial although gaping wound to the anterior right wrist.  Wound was thoroughly irrigated and lysed through full range of motion, no obvious retained foreign bodies or tendon involvement.  Sensation and brisk cap refill intact.  Wound was closed with 2 dermal clips and one Steri-Strip.  Patient will be placed on antibiotics due to approximation of wound after prescription for 12 hours Tdap updated.  Recommend wound check in 2 days, return to ER or see PCP for any infection concerns.   [LM]    Clinical Course User Index [LM] Roque Lias   MDM Rules/Calculators/A&P                           Final Clinical Impression(s) / ED Diagnoses Final diagnoses:  Laceration of right wrist, initial encounter    Rx / DC Orders ED Discharge Orders         Ordered    cephALEXin (KEFLEX) 500 MG capsule  3 times daily        01/08/20 1024           Roque Lias 01/08/20 1026    Gareth Morgan, MD 01/11/20 1031

## 2020-01-08 NOTE — Discharge Instructions (Addendum)
Apply bacitracin to wound twice daily. Keep wound clean and dry. Wound check with your doctor in 2 days, return to the ER or see your doctor at any time for infection concerns. Take Keflex as prescribed to prevent infection.

## 2020-01-08 NOTE — ED Triage Notes (Signed)
Pt arrives with laceration to right wrist, states he was drinking last night when a deck board broke causing him to fall and the glass to break and cut right wrist, unknown last tetanus shot.

## 2022-02-22 ENCOUNTER — Emergency Department (HOSPITAL_BASED_OUTPATIENT_CLINIC_OR_DEPARTMENT_OTHER)
Admission: EM | Admit: 2022-02-22 | Discharge: 2022-02-22 | Disposition: A | Discharge status: Home / Self Care | Payer: BC Managed Care – PPO | Attending: Emergency Medicine | Admitting: Emergency Medicine

## 2022-02-22 ENCOUNTER — Other Ambulatory Visit: Payer: Self-pay

## 2022-02-22 ENCOUNTER — Encounter (HOSPITAL_BASED_OUTPATIENT_CLINIC_OR_DEPARTMENT_OTHER): Payer: Self-pay | Admitting: Emergency Medicine

## 2022-02-22 ENCOUNTER — Emergency Department (HOSPITAL_BASED_OUTPATIENT_CLINIC_OR_DEPARTMENT_OTHER): Payer: BC Managed Care – PPO

## 2022-02-22 DIAGNOSIS — N200 Calculus of kidney: Secondary | ICD-10-CM

## 2022-02-22 DIAGNOSIS — M545 Low back pain, unspecified: Secondary | ICD-10-CM | POA: Diagnosis not present

## 2022-02-22 DIAGNOSIS — N132 Hydronephrosis with renal and ureteral calculous obstruction: Secondary | ICD-10-CM | POA: Diagnosis not present

## 2022-02-22 DIAGNOSIS — R1032 Left lower quadrant pain: Secondary | ICD-10-CM | POA: Diagnosis present

## 2022-02-22 LAB — URINALYSIS, ROUTINE W REFLEX MICROSCOPIC
Bilirubin Urine: NEGATIVE
Glucose, UA: NEGATIVE mg/dL
Ketones, ur: 80 mg/dL — AB
Leukocytes,Ua: NEGATIVE
Nitrite: NEGATIVE
Protein, ur: NEGATIVE mg/dL
Specific Gravity, Urine: 1.02 (ref 1.005–1.030)
pH: 6 (ref 5.0–8.0)

## 2022-02-22 LAB — URINALYSIS, MICROSCOPIC (REFLEX)

## 2022-02-22 MED ORDER — OXYCODONE HCL 5 MG PO TABS
5.0000 mg | ORAL_TABLET | Freq: Once | ORAL | Status: AC
  Administered 2022-02-22: 5 mg via ORAL
  Filled 2022-02-22: qty 1

## 2022-02-22 MED ORDER — TAMSULOSIN HCL 0.4 MG PO CAPS: 0.4000 mg | ORAL_CAPSULE | Freq: Every day | ORAL | 0 refills | Status: AC

## 2022-02-22 MED ORDER — ACETAMINOPHEN 500 MG PO TABS
1000.0000 mg | ORAL_TABLET | Freq: Once | ORAL | Status: AC
  Administered 2022-02-22: 1000 mg via ORAL
  Filled 2022-02-22: qty 2

## 2022-02-22 MED ORDER — DIAZEPAM 5 MG PO TABS
5.0000 mg | ORAL_TABLET | Freq: Once | ORAL | Status: AC
  Administered 2022-02-22: 5 mg via ORAL
  Filled 2022-02-22: qty 1

## 2022-02-22 MED ORDER — MORPHINE SULFATE 15 MG PO TABS: 7.5000 mg | ORAL_TABLET | ORAL | 0 refills | Status: AC | PRN

## 2022-02-22 MED ORDER — KETOROLAC TROMETHAMINE 15 MG/ML IJ SOLN
15.0000 mg | Freq: Once | INTRAMUSCULAR | Status: AC
  Administered 2022-02-22: 15 mg via INTRAMUSCULAR
  Filled 2022-02-22: qty 1

## 2022-02-22 MED ORDER — ONDANSETRON 4 MG PO TBDP: ORAL_TABLET | ORAL | 0 refills | Status: AC

## 2022-02-22 NOTE — Discharge Instructions (Signed)

## 2022-02-22 NOTE — ED Triage Notes (Signed)
Reports left flank pain radiating to groin , urinary frequency dysuria and oliguria. Nausea and emesis.

## 2022-02-22 NOTE — ED Provider Notes (Signed)
[facility] EMERGENCY DEPARTMENT Provider Note   CSN: [id number] Arrival date & time: 02/22/22  0803     History  Chief Complaint  Patient presents with   Flank Pain    Kevin Garza is a 51 y.o. male.  51 yo M with a chief complaints of left-sided low back pain that radiates to the groin.  This has been going on for a couple days now.  Patient has a history of chronic back pain he thinks this feels somewhat different.  It is made him nauseated and vomited and also has had some urinary symptoms.  He feels like the pain also radiates to the left thigh.  Denies loss of bowel or bladder denies loss of peripheral sensation denies numbness or weakness to the leg.  Denies fevers denies recent spinal injection.   Flank Pain       Home Medications Prior to Admission medications   Medication Sig Start Date End Date Taking? Authorizing Provider  morphine (MSIR) 15 MG tablet Take 0.5 tablets (7.5 mg total) by mouth every 4 (four) hours as needed for severe pain. 02/22/22  Yes Deno Etienne, DO  ondansetron (ZOFRAN-ODT) 4 MG disintegrating tablet 4mg  ODT q4 hours prn nausea/vomit 02/22/22  Yes Deno Etienne, DO  tamsulosin (FLOMAX) 0.4 MG CAPS capsule Take 1 capsule (0.4 mg total) by mouth daily after supper. 02/22/22  Yes Deno Etienne, DO  amoxicillin-clavulanate (AUGMENTIN) 875-125 MG tablet Take 1 tablet by mouth 2 (two) times daily. Patient not taking: Reported on 05/18/2019 04/30/17   Jacelyn Pi, Lilia Argue, MD  cetirizine (ZYRTEC) 10 MG tablet Take 10 mg by mouth daily.    [provider]  fluticasone (FLONASE) 50 MCG/ACT nasal spray Place 2 sprays into both nostrils daily. Patient not taking: Reported on 04/30/2017 02/24/16   Wardell Honour, MD  fluticasone [facility]) 50 MCG/ACT nasal spray Place 2 sprays into both nostrils daily. Patient not taking: Reported on 05/18/2019 04/30/17   Jacelyn Pi, Lilia Argue, MD  HYDROcodone-acetaminophen (NORCO/VICODIN) 5-325 MG tablet Take 1  tablet by mouth every 6 (six) hours as needed. 12/06/19   Davonna Belling, MD  meloxicam (MOBIC) 7.5 MG tablet Take 1 tablet (7.5 mg total) by mouth daily as needed for pain. 05/18/19   Wendie Agreste, MD  predniSONE (DELTASONE) 20 MG tablet Take 2 tablets (40 mg total) by mouth daily. 12/06/19   Davonna Belling, MD      Allergies    Patient has no known allergies.    Review of Systems   Review of Systems  Genitourinary:  Positive for flank pain.    Physical Exam Updated Vital Signs BP (!) 150/92   Pulse 70   Temp 97.7 F (36.5 C)   Resp 20   Ht 5\' 8"  (1.727 m)   Wt 76.2 kg   SpO2 100%   BMI 25.54 kg/m  Physical Exam Vitals and nursing note reviewed.  Constitutional:      Appearance: He is well-developed.  HENT:     Head: Normocephalic and atraumatic.  Eyes:     Pupils: Pupils are equal, round, and reactive to light.  Neck:     Vascular: No JVD.  Cardiovascular:     Rate and Rhythm: Normal rate and regular rhythm.     Heart sounds: No murmur heard.    No friction rub. No gallop.  Pulmonary:     Effort: No respiratory distress.     Breath sounds: No wheezing.  Abdominal:  General: There is no distension.     Tenderness: There is no abdominal tenderness. There is no guarding or rebound.  Musculoskeletal:        General: Normal range of motion.     Cervical back: Normal range of motion and neck supple.     Comments: Pain to the left thoracic paraspinal musculature about T10-12.  Reproduces his symptoms.  No abdominal pain.  Pulse motor and sensation intact to the left lower extremity.  Negative straight leg raise test.  Reflexes are 2+ and equal there is no clonus.  Skin:    Coloration: Skin is not pale.     Findings: No rash.  Neurological:     Mental Status: He is alert and oriented to person, place, and time.  Psychiatric:        Behavior: Behavior normal.     ED Results / Procedures / Treatments   Labs (all labs ordered are listed, but only abnormal  results are displayed) Labs Reviewed  URINALYSIS, ROUTINE W REFLEX MICROSCOPIC - Abnormal; Notable for the following components:      Result Value   Hgb urine dipstick LARGE (*)    Ketones, ur 80 (*)    All other components within normal limits  URINALYSIS, MICROSCOPIC (REFLEX) - Abnormal; Notable for the following components:   Bacteria, UA RARE (*)    All other components within normal limits    EKG None  Radiology CT Renal Stone Study  Result Date: 02/22/2022 CLINICAL DATA:  Left flank pain radiating to groin with urinary frequency, dysuria, oliguria EXAM: CT ABDOMEN AND PELVIS WITHOUT CONTRAST TECHNIQUE: Multidetector CT imaging of the abdomen and pelvis was performed following the standard protocol without IV contrast. RADIATION DOSE REDUCTION: This exam was performed according to the departmental dose-optimization program which includes automated exposure control, adjustment of the mA and/or kV according to patient size and/or use of iterative reconstruction technique. COMPARISON:  None Available. FINDINGS: Lower chest: The lung bases are clear. The imaged heart is unremarkable. Hepatobiliary: There are punctate calcifications in the right hepatic lobe, nonspecific. The liver is otherwise unremarkable, within the confines of noncontrast technique. The gallbladder is unremarkable. There is no biliary ductal dilatation. Pancreas: Unremarkable. Spleen: Unremarkable. Adrenals/Urinary Tract: The adrenals are unremarkable. There is a 4 mm stone the left UVJ with mild upstream hydroureteronephrosis and perinephric stranding. There are no other stones in the kidneys or along the course of either ureter. There is no right hydroureteronephrosis. There are no focal renal lesions, within the confines of noncontrast technique. The bladder is underdistended with significant wall thickening. Stomach/Bowel: The stomach is unremarkable. There is no evidence of bowel obstruction. There is no abnormal bowel  wall thickening or inflammatory change. There is no evidence of appendicitis. Vascular/Lymphatic: The abdominal aorta is normal in course and caliber with scattered calcified plaque. There is no abdominopelvic lymphadenopathy. Reproductive: Prostate and seminal vesicles are unremarkable. Other: There is no ascites or free air. Musculoskeletal: There is indistinctness of the bilateral SI joints with sclerosis along the iliac side more so on the left. There is partial fusion of the lower thoracic vertebral bodies as well as the lower lumbar posterior elements. There is endplate irregularity along the T12-L1 disc space. IMPRESSION: 1. 4 mm obstructing stone at the left UVJ with mild upstream hydroureteronephrosis and perinephric stranding. 2. Bladder wall thickening which may be related to underdistention, though cystitis is not excluded. Correlate with urinalysis. 3. Indistinctness of the bilateral SI joints with adjacent sclerosis worse on  the left may reflect sacroiliitis. In combination with partial fusion of the lower thoracic vertebral bodies and lower lumbar posterior elements, findings may reflect ankylosing spondylitis. Correlate with history. 4. Irregularity along the T12-L1 endplate may be degenerative in nature, though discitis/osteomyelitis could have a similar appearance. Correlate with history and lab values. Consider lumbar spine MRI with and without contrast as indicated. Electronically Signed   By: Lesia Hausen M.D.   On: 02/22/2022 08:48    Procedures Procedures    Medications Ordered in ED Medications  acetaminophen (TYLENOL) tablet 1,000 mg (1,000 mg Oral Given 02/22/22 0102)  ketorolac (TORADOL) 15 MG/ML injection 15 mg (15 mg Intramuscular Given 02/22/22 7253)  oxyCODONE (Oxy IR/ROXICODONE) immediate release tablet 5 mg (5 mg Oral Given 02/22/22 6644)  diazepam (VALIUM) tablet 5 mg (5 mg Oral Given 02/22/22 0347)    ED Course/ Medical Decision Making/ A&P                            Medical Decision Making Amount and/or Complexity of Data Reviewed Labs: ordered. Radiology: ordered.  Risk OTC drugs. Prescription drug management.   51 yo M with a chief complaint of left-sided low back pain that radiates to the groin.  Going on for about 48 hours.  Has had some urinary symptoms and nausea and vomiting which are not typical with his chronic low back pain.  He has a significant past medical history of ankylosing spondylitis.  I reviewed his medical records and he is not on immune suppression at this time he is awaiting tuberculosis treatment.  We will obtain a CT stone study to assess for kidney stones.  UA as he is having urinary symptoms.  Treat pain here.  CT scan does show on my independent interpretation a left UVJ stone.  Radiology read 4 mm.  Patient feeling much better on repeat assessment.  UA independently interpreted by me without infection.  We will have him follow-up with urology.  9:11 AM:  I have discussed the diagnosis/risks/treatment options with the patient and family.  Evaluation and diagnostic testing in the emergency department does not suggest an emergent condition requiring admission or immediate intervention beyond what has been performed at this time.  They will follow up with Urology. We also discussed returning to the ED immediately if new or worsening sx occur. We discussed the sx which are most concerning (e.g., sudden worsening pain, fever, inability to tolerate by mouth) that necessitate immediate return. Medications administered to the patient during their visit and any new prescriptions provided to the patient are listed below.  Medications given during this visit Medications  acetaminophen (TYLENOL) tablet 1,000 mg (1,000 mg Oral Given 02/22/22 4259)  ketorolac (TORADOL) 15 MG/ML injection 15 mg (15 mg Intramuscular Given 02/22/22 5638)  oxyCODONE (Oxy IR/ROXICODONE) immediate release tablet 5 mg (5 mg Oral Given 02/22/22 7564)  diazepam  (VALIUM) tablet 5 mg (5 mg Oral Given 02/22/22 3329)     The patient appears reasonably screen and/or stabilized for discharge and I doubt any other medical condition or other [facility] requiring further screening, evaluation, or treatment in the ED at this time prior to discharge.          Final Clinical Impression(s) / ED Diagnoses Final diagnoses:  Nephrolithiasis    Rx / DC Orders ED Discharge Orders          Ordered    morphine (MSIR) 15 MG tablet  Every 4 hours PRN  02/22/22 0900    ondansetron (ZOFRAN-ODT) 4 MG disintegrating tablet        02/22/22 0900    tamsulosin (FLOMAX) 0.4 MG CAPS capsule  Daily after supper        02/22/22 0900              Melene Plan, DO 02/22/22 0923
